# Patient Record
Sex: Male | Born: 1965 | Race: White | Hispanic: No | Marital: Married | Smoking: Former smoker
Health system: Southern US, Community
[De-identification: ages and names within clinical notes are randomized; demographics above are authoritative.]

## PROBLEM LIST (undated history)

## (undated) DIAGNOSIS — E785 Hyperlipidemia, unspecified: Secondary | ICD-10-CM

## (undated) DIAGNOSIS — M1990 Unspecified osteoarthritis, unspecified site: Secondary | ICD-10-CM

## (undated) DIAGNOSIS — T7840XA Allergy, unspecified, initial encounter: Secondary | ICD-10-CM

## (undated) DIAGNOSIS — F070 Personality change due to known physiological condition: Secondary | ICD-10-CM

## (undated) DIAGNOSIS — I10 Essential (primary) hypertension: Secondary | ICD-10-CM

## (undated) DIAGNOSIS — K219 Gastro-esophageal reflux disease without esophagitis: Secondary | ICD-10-CM

## (undated) DIAGNOSIS — G40909 Epilepsy, unspecified, not intractable, without status epilepticus: Secondary | ICD-10-CM

## (undated) HISTORY — DX: Unspecified osteoarthritis, unspecified site: M19.90

## (undated) HISTORY — DX: Gastro-esophageal reflux disease without esophagitis: K21.9

## (undated) HISTORY — DX: Epilepsy, unspecified, not intractable, without status epilepticus: G40.909

## (undated) HISTORY — DX: Essential (primary) hypertension: I10

## (undated) HISTORY — DX: Hyperlipidemia, unspecified: E78.5

## (undated) HISTORY — PX: CHOLECYSTECTOMY: SHX55

## (undated) HISTORY — DX: Allergy, unspecified, initial encounter: T78.40XA

## (undated) HISTORY — DX: Personality change due to known physiological condition: F07.0

---

## 2001-04-29 ENCOUNTER — Emergency Department (HOSPITAL_COMMUNITY): Admission: EM | Admit: 2001-04-29 | Discharge: 2001-04-29 | Payer: Self-pay | Admitting: Nurse Practitioner

## 2001-12-28 ENCOUNTER — Emergency Department (HOSPITAL_COMMUNITY): Admission: EM | Admit: 2001-12-28 | Discharge: 2001-12-28 | Payer: Self-pay | Admitting: *Deleted

## 2002-01-28 ENCOUNTER — Encounter (INDEPENDENT_AMBULATORY_CARE_PROVIDER_SITE_OTHER): Payer: Self-pay | Admitting: Specialist

## 2002-01-28 ENCOUNTER — Observation Stay (HOSPITAL_COMMUNITY): Admission: RE | Admit: 2002-01-28 | Discharge: 2002-01-29 | Payer: Self-pay | Admitting: General Surgery

## 2004-07-05 ENCOUNTER — Emergency Department (HOSPITAL_COMMUNITY): Admission: EM | Admit: 2004-07-05 | Discharge: 2004-07-05 | Payer: Self-pay | Admitting: Family Medicine

## 2005-05-28 ENCOUNTER — Ambulatory Visit: Payer: Self-pay | Admitting: Internal Medicine

## 2006-11-11 ENCOUNTER — Emergency Department (HOSPITAL_COMMUNITY): Admission: EM | Admit: 2006-11-11 | Discharge: 2006-11-11 | Payer: Self-pay | Admitting: Emergency Medicine

## 2007-06-23 ENCOUNTER — Ambulatory Visit: Payer: Self-pay | Admitting: Internal Medicine

## 2007-06-27 ENCOUNTER — Encounter: Payer: Self-pay | Admitting: Internal Medicine

## 2007-06-27 DIAGNOSIS — R569 Unspecified convulsions: Secondary | ICD-10-CM | POA: Insufficient documentation

## 2008-12-12 ENCOUNTER — Ambulatory Visit: Payer: Self-pay | Admitting: Internal Medicine

## 2008-12-12 DIAGNOSIS — F070 Personality change due to known physiological condition: Secondary | ICD-10-CM

## 2008-12-12 DIAGNOSIS — F988 Other specified behavioral and emotional disorders with onset usually occurring in childhood and adolescence: Secondary | ICD-10-CM | POA: Insufficient documentation

## 2008-12-12 DIAGNOSIS — R21 Rash and other nonspecific skin eruption: Secondary | ICD-10-CM | POA: Insufficient documentation

## 2008-12-12 DIAGNOSIS — I10 Essential (primary) hypertension: Secondary | ICD-10-CM | POA: Insufficient documentation

## 2008-12-12 HISTORY — DX: Personality change due to known physiological condition: F07.0

## 2008-12-13 LAB — CONVERTED CEMR LAB
ALT: 44 units/L (ref 0–53)
AST: 30 units/L (ref 0–37)
Albumin: 4.2 g/dL (ref 3.5–5.2)
Alkaline Phosphatase: 72 units/L (ref 39–117)
BUN: 8 mg/dL (ref 6–23)
Basophils Absolute: 0.1 10*3/uL (ref 0.0–0.1)
Basophils Relative: 1 % (ref 0.0–3.0)
Bilirubin Urine: NEGATIVE
Bilirubin, Direct: 0.1 mg/dL (ref 0.0–0.3)
CO2: 29 meq/L (ref 19–32)
Calcium: 9.3 mg/dL (ref 8.4–10.5)
Chloride: 106 meq/L (ref 96–112)
Cholesterol: 253 mg/dL — ABNORMAL HIGH (ref 0–200)
Creatinine, Ser: 0.7 mg/dL (ref 0.4–1.5)
Direct LDL: 189.8 mg/dL
Eosinophils Absolute: 0.2 10*3/uL (ref 0.0–0.7)
Eosinophils Relative: 2.6 % (ref 0.0–5.0)
GFR calc non Af Amer: 132.39 mL/min (ref >60)
Glucose, Bld: 85 mg/dL (ref 70–99)
HCT: 43.9 % (ref 39.0–52.0)
HDL: 34.8 mg/dL — ABNORMAL LOW (ref >39.00)
Hemoglobin, Urine: NEGATIVE
Hemoglobin: 15.8 g/dL (ref 13.0–17.0)
Ketones, ur: NEGATIVE mg/dL
Leukocytes, UA: NEGATIVE
Lymphocytes Relative: 30.7 % (ref 12.0–46.0)
Lymphs Abs: 1.9 10*3/uL (ref 0.7–4.0)
MCHC: 36 g/dL (ref 30.0–36.0)
MCV: 88.7 fL (ref 78.0–100.0)
Monocytes Absolute: 0.4 10*3/uL (ref 0.1–1.0)
Monocytes Relative: 7 % (ref 3.0–12.0)
Neutro Abs: 3.5 10*3/uL (ref 1.4–7.7)
Neutrophils Relative %: 58.7 % (ref 43.0–77.0)
Nitrite: NEGATIVE
PSA: 1.2 ng/mL (ref 0.10–4.00)
Platelets: 203 10*3/uL (ref 150.0–400.0)
Potassium: 4.6 meq/L (ref 3.5–5.1)
RBC: 4.95 M/uL (ref 4.22–5.81)
RDW: 11.5 % (ref 11.5–14.6)
Sodium: 143 meq/L (ref 135–145)
Specific Gravity, Urine: 1.02 (ref 1.000–1.030)
TSH: 1.24 microintl units/mL (ref 0.35–5.50)
Total Bilirubin: 0.8 mg/dL (ref 0.3–1.2)
Total CHOL/HDL Ratio: 7
Total Protein, Urine: NEGATIVE mg/dL
Total Protein: 7.1 g/dL (ref 6.0–8.3)
Triglycerides: 229 mg/dL — ABNORMAL HIGH (ref 0.0–149.0)
Urine Glucose: NEGATIVE mg/dL
Urobilinogen, UA: 0.2 (ref 0.0–1.0)
VLDL: 45.8 mg/dL — ABNORMAL HIGH (ref 0.0–40.0)
Valproic Acid Lvl: 51.4 ug/mL (ref 50.0–100.0)
WBC: 6.1 10*3/uL (ref 4.5–10.5)
pH: 5.5 (ref 5.0–8.0)

## 2010-10-23 ENCOUNTER — Ambulatory Visit
Admission: RE | Admit: 2010-10-23 | Discharge: 2010-10-23 | Payer: Self-pay | Source: Home / Self Care | Attending: Internal Medicine | Admitting: Internal Medicine

## 2010-10-23 ENCOUNTER — Other Ambulatory Visit: Payer: Self-pay | Admitting: Internal Medicine

## 2010-10-23 ENCOUNTER — Encounter: Payer: Self-pay | Admitting: Internal Medicine

## 2010-10-23 DIAGNOSIS — E785 Hyperlipidemia, unspecified: Secondary | ICD-10-CM | POA: Insufficient documentation

## 2010-10-23 DIAGNOSIS — H0019 Chalazion unspecified eye, unspecified eyelid: Secondary | ICD-10-CM | POA: Insufficient documentation

## 2010-10-23 LAB — URINALYSIS
Bilirubin Urine: NEGATIVE
Hemoglobin, Urine: NEGATIVE
Ketones, ur: NEGATIVE
Leukocytes, UA: NEGATIVE
Nitrite: NEGATIVE
Specific Gravity, Urine: 1.025 (ref 1.000–1.030)
Total Protein, Urine: NEGATIVE
Urine Glucose: NEGATIVE
Urobilinogen, UA: 1 (ref 0.0–1.0)
pH: 6 (ref 5.0–8.0)

## 2010-10-23 LAB — LIPID PANEL
Cholesterol: 240 mg/dL — ABNORMAL HIGH (ref 0–200)
HDL: 37.8 mg/dL — ABNORMAL LOW (ref >39.00)
Total CHOL/HDL Ratio: 6
Triglycerides: 335 mg/dL — ABNORMAL HIGH (ref 0.0–149.0)
VLDL: 67 mg/dL — ABNORMAL HIGH (ref 0.0–40.0)

## 2010-10-23 LAB — CBC WITH DIFFERENTIAL/PLATELET
Basophils Absolute: 0.1 10*3/uL (ref 0.0–0.1)
Basophils Relative: 0.6 % (ref 0.0–3.0)
Eosinophils Absolute: 0.2 10*3/uL (ref 0.0–0.7)
Eosinophils Relative: 2.1 % (ref 0.0–5.0)
HCT: 43.1 % (ref 39.0–52.0)
Hemoglobin: 15.3 g/dL (ref 13.0–17.0)
Lymphocytes Relative: 28.1 % (ref 12.0–46.0)
Lymphs Abs: 2.5 10*3/uL (ref 0.7–4.0)
MCHC: 35.4 g/dL (ref 30.0–36.0)
MCV: 90.3 fl (ref 78.0–100.0)
Monocytes Absolute: 0.7 10*3/uL (ref 0.1–1.0)
Monocytes Relative: 7.4 % (ref 3.0–12.0)
Neutro Abs: 5.5 10*3/uL (ref 1.4–7.7)
Neutrophils Relative %: 61.8 % (ref 43.0–77.0)
Platelets: 239 10*3/uL (ref 150.0–400.0)
RBC: 4.78 Mil/uL (ref 4.22–5.81)
RDW: 13 % (ref 11.5–14.6)
WBC: 8.9 10*3/uL (ref 4.5–10.5)

## 2010-10-23 LAB — BASIC METABOLIC PANEL
BUN: 10 mg/dL (ref 6–23)
CO2: 29 mEq/L (ref 19–32)
Calcium: 9.5 mg/dL (ref 8.4–10.5)
Chloride: 104 mEq/L (ref 96–112)
Creatinine, Ser: 0.7 mg/dL (ref 0.4–1.5)
GFR: 137.99 mL/min (ref >60.00)
Glucose, Bld: 76 mg/dL (ref 70–99)
Potassium: 4.5 mEq/L (ref 3.5–5.1)
Sodium: 141 mEq/L (ref 135–145)

## 2010-10-23 LAB — HEPATIC FUNCTION PANEL
ALT: 49 U/L (ref 0–53)
AST: 28 U/L (ref 0–37)
Albumin: 4.3 g/dL (ref 3.5–5.2)
Alkaline Phosphatase: 70 U/L (ref 39–117)
Bilirubin, Direct: 0.1 mg/dL (ref 0.0–0.3)
Total Bilirubin: 0.6 mg/dL (ref 0.3–1.2)
Total Protein: 7.4 g/dL (ref 6.0–8.3)

## 2010-10-23 LAB — TSH: TSH: 0.91 u[IU]/mL (ref 0.35–5.50)

## 2010-10-23 LAB — PSA: PSA: 0.29 ng/mL (ref 0.10–4.00)

## 2010-10-23 LAB — LDL CHOLESTEROL, DIRECT: Direct LDL: 188.9 mg/dL

## 2010-10-24 LAB — CONVERTED CEMR LAB: Valproic Acid Lvl: 16 ug/mL — ABNORMAL LOW (ref 50.0–100.0)

## 2010-10-31 NOTE — Assessment & Plan Note (Signed)
Summary: last ov 2010/skin tag/cd   Vital Signs:  Patient profile:   45 year old male Height:      73 inches Weight:      230 pounds BMI:     30.45 O2 Sat:      96 % on Room air Temp:     98.4 degrees F oral Pulse rate:   99 / minute BP sitting:   144 / 90  (left arm) Cuff size:   large  Vitals Entered By: Zella Ball Ewing CMA (AAMA) (October 23, 2010 2:45 PM)  O2 Flow:  Room air  Preventive Care Screening     decliens tetanus today  CC: Elevated BP, skin tags/RE   CC:  Elevated BP and skin tags/RE.  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  No fever, wt loss, night sweats, loss of appetite or other constitutional symptoms  Denies worsening depressive symptoms, suicidal ideation, or panic.  Overall good compliance with meds, and good tolerability.  Pt states good ability with ADL's, low fall risk, home safety reviewed and adequate, no significant change in hearing or vision, trying to follow lower chol diet, and occasionally active only with regular excercise.   Preventive Screening-Counseling & Management      Drug Use:  no.    Problems Prior to Update: 1)  Hyperlipidemia  (ICD-272.4) 2)  Hypertension  (ICD-401.9) 3)  Poor Concentration   (ICD-310.1) 4)  Rash-nonvesicular  (ICD-782.1) 5)  Preventive Health Care  (ICD-V70.0) 6)  Seizure Disorder  (ICD-780.39)  Medications Prior to Update: 1)  Depakote 500 Mg Tbec (Divalproex Sodium) .Marland Kitchen.. 1 By Mouth in The Morning and 1 By Mouth At Night 2)  Ketoconazole 200 Mg Tabs (Ketoconazole) .Marland Kitchen.. 1 By Mouth Two Times A Day 3)  Lisinopril 10 Mg Tabs (Lisinopril) .... By Mouth Once Daily  Current Medications (verified): 1)  Depakote 500 Mg Tbec (Divalproex Sodium) .Marland Kitchen.. 1 By Mouth in The Morning and 1 By Mouth At Night 2)  Ketoconazole 200 Mg Tabs (Ketoconazole) .Marland Kitchen.. 1 By Mouth Two Times A Day 3)  Lisinopril 10 Mg Tabs (Lisinopril) .... By Mouth Once Daily 4)  Atorvastatin Calcium 10 Mg Tabs (Atorvastatin Calcium) .Marland Kitchen.. 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[place] Current Smoker - 1/2 ppd Alcohol use-yes - 12 pk /wk Drug use-no  Risk Factors: Smoking Status: current (12/12/2008) Packs/Day: 1 PPD (06/27/2007)  Past Medical History: Seizure disorder - none since 2001 Hypertension Hyperlipidemia  Social History: Married no biological children, has one step son 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         all otherwise negative per pt -    Physical Exam  General:  alert and overweight-appearing.   Head:  normocephalic and atraumatic.   Eyes:  pupils equal and pupils round.  ,  has 2 mm skin tag to right temple approx .5 cm lateral to right eye, also has right lower mid eyelid edge 5 mm chalazion Ears:  R ear normal and L ear normal.   Nose:  no external deformity and no external erythema.   Mouth:  good dentition and no gingival abnormalities.   Neck:  supple and no masses.   Lungs:  normal respiratory effort and normal breath sounds.   Heart:  normal rate, regular rhythm, and no murmur.   Abdomen:  soft, non-tender, and normal bowel sounds.   Msk:  no joint tenderness and no joint swelling.   Extremities:  no edema, no ulcers  Neurologic:  cranial nerves II-XII intact and strength normal in all extremities.     Impression & Recommendations:  Problem # 1:  Preventive Health Care (ICD-V70.0) Overall doing well, age appropriate education and counseling updated, referral for preventive services and immunizations addressed, dietary counseling and smoking status adressed , most recent labs reviewed I have personally reviewed and have noted 1.The patient's medical and social history 2.Their use of alcohol, tobacco or illicit drugs 3.Their current medications and supplements 4. Functional ability including ADL's, fall risk, home safety risk, hearing & visual impairment  5.Diet and physical activities 6.Evidence for depression or mood disorders The patients weight, height, BMI  have been recorded in the chart I have made referrals, counseling and provided education to the patient based review of the above  Orders: TLB-BMP (Basic Metabolic Panel-BMET) (80048-METABOL) TLB-CBC Platelet - w/Differential (85025-CBCD) TLB-Hepatic/Liver Function Pnl (80076-HEPATIC) TLB-Lipid Panel (80061-LIPID) TLB-TSH (Thyroid  Stimulating Hormone) (84443-TSH) TLB-PSA (Prostate Specific Antigen) (84153-PSA) TLB-Udip ONLY (81003-UDIP)  Problem # 2:  SEIZURE DISORDER (ICD-780.39)  His updated medication list for this problem includes:    Depakote 500 Mg Tbec (Divalproex sodium) .Marland Kitchen... 1 by mouth in the morning and 1 by mouth at night will check lab and forward to neuro per pt request, no recent siezure  Orders: T-Valproic Acid (Depakene) (91478-29562)  Problem # 3:  HYPERTENSION (ICD-401.9)  His updated medication list for this problem includes:    Lisinopril 10 Mg Tabs (Lisinopril) ..... By mouth once daily  BP today: 144/90 Prior BP: 140/110 (12/12/2008)  Labs Reviewed: K+: 4.6 (12/12/2008) Creat: : 0.7 (12/12/2008)   Chol: 253 (12/12/2008)   HDL: 34.80 (12/12/2008)   TG: 229.0 (12/12/2008) uncontrolle - to re-start med - treat as above, f/u any worsening signs or symptoms   Problem # 4:  HYPERLIPIDEMIA (ICD-272.4)  His updated medication list for this problem includes:    Atorvastatin Calcium 10 Mg Tabs (Atorvastatin calcium) .Marland Kitchen... 1 by mouth once daily  Labs Reviewed: SGOT: 30 (12/12/2008)   SGPT: 44 (12/12/2008)   HDL:34.80 (12/12/2008)  Chol:253 (12/12/2008)  Trig:229.0 (  12/12/2008) d/w pt  - he admits to dietary noncomplaicne, will do better and also elects to start lipitor with f/u labs  Problem # 5:  CHALAZION, RIGHT (ICD-373.2) c/w pt   - he elects to research local opthomology and self refer for further eval and tx  Complete Medication List: 1)  Depakote 500 Mg Tbec (Divalproex sodium) .Marland Kitchen.. 1 by mouth in the morning and 1 by mouth at night 2)  Ketoconazole 200 Mg Tabs (Ketoconazole) .Marland Kitchen.. 1 by mouth two times a day 3)  Lisinopril 10 Mg Tabs (Lisinopril) .... By mouth once daily 4)  Atorvastatin Calcium 10 Mg Tabs (Atorvastatin calcium) .Marland Kitchen.. 1 by mouth once daily  Patient Instructions: 1)  Please stop smoking 2)  Please take all new medications as prescribed 3)  Continue all previous  medications as before this visit  4)  Please go to the Lab in the basement for your blood and/or urine tests today 5)  Please call the number on the [facility] Card for results of your testing 6)  You should see an opthomologist about the cyst to the right lower eyelid 7)  We will fax the results of your blood work to Neurology when available 8)  Please schedule a follow-up appointment in 1 year, or sooner if needed 9)  Check your Blood Pressure regularly. If it is above 140/90: you should make an appointment. Prescriptions: ATORVASTATIN CALCIUM 10 MG TABS (ATORVASTATIN CALCIUM) 1 by mouth once daily  #90 x 3   Entered and Authorized by:   Corwin Levins MD   Signed by:   Corwin Levins MD on 10/23/2010   Method used:   Electronically to        [facility] [phone]* (retail)       [address]       [place], [place]  [address]       Ph: [phone]       Fax: [phone]   RxID:   [id number] LISINOPRIL 10 MG TABS (LISINOPRIL) by mouth once daily  #90 x 3   Entered and Authorized by:   Corwin Levins MD   Signed by:   Corwin Levins MD on 10/23/2010   Method used:   Electronically to        [facility] [phone]* (retail)       [address]       [place], [place]  [address]       Ph: [phone]       Fax: [phone]   RxID:   [id number] KETOCONAZOLE 200 MG TABS (KETOCONAZOLE) 1 by mouth two times a day  #14 x 0   Entered and Authorized by:   Corwin Levins MD   Signed by:   Corwin Levins MD on 10/23/2010   Method used:   Electronically to        [facility] [phone]* (retail)       [address]       [place], [place]  [address]       Ph: [phone]       Fax: [phone]   RxID:   [id number]    Orders Added: 1)  T-Valproic Acid (Depakene) [80164-23520] 2)  TLB-BMP (Basic Metabolic Panel-BMET) [80048-METABOL] 3)  TLB-CBC Platelet - w/Differential [85025-CBCD] 4)  TLB-Hepatic/Liver Function Pnl [80076-HEPATIC] 5)  TLB-Lipid Panel [80061-LIPID] 6)   TLB-TSH (Thyroid Stimulating Hormone) [84443-TSH] 7)  TLB-PSA (Prostate Specific Antigen) [84153-PSA] 8)  TLB-Udip ONLY [81003-UDIP] 9)  Est. Patient 40-64 years [phone]

## 2011-02-14 NOTE — Op Note (Signed)
[facility]  Patient:    Kenneth Atkins, Kenneth Atkins Visit Number: [id number] MRN: [id number]          Service Type: SUR Location: [id number] Attending Physician:  Delsa Bern Dictated by:   Lorne Skeens. Hoxworth, M.D. Proc. Date: 01/28/02 Admit Date:  01/28/2002                             Operative Report  PREOPERATIVE DIAGNOSIS:  Symptomatic cholelithiasis.  POSTOPERATIVE DIAGNOSIS:  Symptomatic cholelithiasis.  SURGICAL PROCEDURE:  Laparoscopic cholecystectomy.  SURGEON:  Lorne Skeens. Hoxworth, M.D.  ASSISTANT:  Currie Paris, M.D.  ANESTHESIA:  General.  BRIEF HISTORY:  Kenneth Atkins is a 45 year old white male, who recently presented to the emergency room with severe epigastric right upper quadrant abdominal pain, radiating to his flank, associated with nausea and vomiting. Work-up included a gallbladder ultrasound, showing numerous gallstones. Common bile duct was normal.  Laparoscopic cholecystectomy has been recommended and accepted.  The nature of the procedure, its indications, risks of bleeding, infection, bile leak, and bile duct injury were discussed and understood preoperatively.  He is now brought to the operating room for this procedure.  DESCRIPTION OF OPERATION:  The patient brought to the operating room and placed in the supine position on the operating table, and general endotracheal anesthesia was induced.  PAS were in place.  He was given preoperative antibiotics.  The abdomen was sterilely prepped and draped.  Local anesthesia was used to infiltrate the trocar sites.  A 1 cm incision was made at the umbilicus and dissection carried down the midline fascia which was sharply incised for 1 cm and the peritoneum entered under direct vision.  Through a mattress suture of 0 Vicryl, the Hasson trocar was placed and pneumoperitoneum established.  Under direct vision, a 10 mm trocar was placed in the subxiphoid area  and two 5 mm trocars on the right subcostal margin.  The gallbladder was visualized.  It was not acutely inflamed but was literally packed with stones. The fundus was grasped and elevated up over the liver, and the infundibulum retracted inferolaterally.  Fibrofatty tissue was stripped off the neck of the gallbladder toward the porta hepatis, and the distal gallbladder was thoroughly dissected.  The cystic artery was seen coursing up onto the gallbladder wall.  The cystic duct was identified and dissected free and the cystic duct gallbladder junction dissected free 360 degrees.  When the anatomy was clear, the cystic artery was doubly clipped proximally, clipped distally, and divided, and the cystic duct was triply clipped proximally, clipped distally, and divided.  The gallbladder was then dissected free from its bed using hook cautery and removed through the umbilicus.  Complete hemostasis was assured in the operative site.  Trocars were removed under direct vision and all CO2 evacuated from the peritoneal cavity.  The mattress suture was secured at the umbilicus.  Skin incisions were closed with interrupted subcuticular 4-0 Monocryl and Steri-Strips.  Sponge, needle, and instrument counts were correct.  Dry sterile dressing was applied, and the patient was taken to recovery in good condition. Dictated by:   Lorne Skeens. Hoxworth, M.D. Attending Physician:  Delsa Bern DD:  01/28/02 TD:  01/29/02 Job: [id number]

## 2011-10-02 ENCOUNTER — Ambulatory Visit: Payer: Self-pay | Admitting: Internal Medicine

## 2011-10-03 ENCOUNTER — Ambulatory Visit (INDEPENDENT_AMBULATORY_CARE_PROVIDER_SITE_OTHER): Payer: BC Managed Care – PPO | Admitting: Internal Medicine

## 2011-10-03 ENCOUNTER — Encounter: Payer: Self-pay | Admitting: Internal Medicine

## 2011-10-03 VITALS — BP 118/90 | HR 88 | Temp 98.5°F | Ht 73.0 in | Wt 233.4 lb

## 2011-10-03 DIAGNOSIS — I10 Essential (primary) hypertension: Secondary | ICD-10-CM

## 2011-10-03 DIAGNOSIS — Z0001 Encounter for general adult medical examination with abnormal findings: Secondary | ICD-10-CM | POA: Insufficient documentation

## 2011-10-03 DIAGNOSIS — H0019 Chalazion unspecified eye, unspecified eyelid: Secondary | ICD-10-CM

## 2011-10-03 DIAGNOSIS — Z0000 Encounter for general adult medical examination without abnormal findings: Secondary | ICD-10-CM

## 2011-10-03 DIAGNOSIS — H0014 Chalazion left upper eyelid: Secondary | ICD-10-CM | POA: Insufficient documentation

## 2011-10-03 NOTE — Patient Instructions (Signed)
No new medications today You will be contacted regarding the referral for: opthamology Please return in 6 mo with Lab testing done 3-5 days before

## 2011-10-04 ENCOUNTER — Encounter: Payer: Self-pay | Admitting: Internal Medicine

## 2011-10-04 NOTE — Progress Notes (Signed)
  Subjective:    Patient ID: Kenneth Atkins, male    DOB: [birth year], 46 y.o.   MRN: [id number]  HPI  Here with recent stye to the left upper eyelid that did not resolve, but now with a cystic swelling for the past wk;   Pt denies fever, wt loss, night sweats, loss of appetite, or other constitutional symptoms. Pt denies chest pain, increased sob or doe, wheezing, orthopnea, PND, increased LE swelling, palpitations, dizziness or syncope.  Pt denies new neurological symptoms such as new headache, or facial or extremity weakness or numbness   Pt denies polydipsia, polyuria Past Medical History  Diagnosis Date  . Hypertension   . Hyperlipidemia   . Seizure disorder     None since 2001   Past Surgical History  Procedure Date  . Cholecystectomy     reports that he has been smoking.  He does not have any smokeless tobacco history on file. He reports that he drinks about 7.2 ounces of alcohol per week. He reports that he does not use illicit drugs. family history includes Alcohol abuse in his father and Hypertension in his brother and father. No Known Allergies Current Outpatient Prescriptions on File Prior to Visit  Medication Sig Dispense Refill  . atorvastatin (LIPITOR) 10 MG tablet Take 10 mg by mouth daily.        . divalproex (DEPAKOTE) 500 MG DR tablet Take 500 mg by mouth 2 (two) times daily.        Marland Kitchen lisinopril (PRINIVIL,ZESTRIL) 10 MG tablet Take 10 mg by mouth daily.        Marland Kitchen ketoconazole (NIZORAL) 200 MG tablet Take 200 mg by mouth 2 (two) times daily.         Review of Systems All otherwise neg per pt    Objective:   Physical Exam BP 118/90  Pulse 88  Temp(Src) 98.5 F (36.9 C) (Oral)  Ht 6\' 1"  (1.854 m)  Wt 233 lb 6 oz (105.858 kg)  BMI 30.79 kg/m2  SpO2 97% Physical Exam  VS noted Constitutional: Pt appears well-developed and well-nourished.  HENT: Head: Normocephalic. Left upper eyelid with approx 10 mm chalazion, nontedner, minor erythema, nonfluctuant Right Ear:  External ear normal.  Left Ear: External ear normal.  Eyes: Conjunctivae and EOM are normal. Pupils are equal, round, and reactive to light.  Neck: Normal range of motion. Neck supple.  Cardiovascular: Normal rate and regular rhythm.   Pulmonary/Chest: Effort normal and breath sounds normal.  Abd:  Soft, NT, non-distended, + BS Neurological: Pt is alert. No cranial nerve deficit.  Skin: Skin is warm. No erythema.  Psychiatric: Pt behavior is normal. Thought content normal.     Assessment & Plan:

## 2011-10-04 NOTE — Assessment & Plan Note (Signed)
Mild, d/w pt, for optho referral,  to f/u any worsening symptoms or concerns

## 2011-10-04 NOTE — Assessment & Plan Note (Signed)
stable overall by hx and exam, most recent data reviewed with pt, and pt to continue medical treatment as before  BP Readings from Last 3 Encounters:  10/03/11 118/90  10/23/10 144/90  12/12/08 140/110

## 2012-03-22 ENCOUNTER — Other Ambulatory Visit: Payer: Self-pay | Admitting: Internal Medicine

## 2012-04-20 ENCOUNTER — Other Ambulatory Visit: Payer: Self-pay

## 2012-04-20 MED ORDER — LISINOPRIL 10 MG PO TABS: 10.0000 mg | ORAL_TABLET | Freq: Every day | ORAL | Status: DC

## 2012-11-09 ENCOUNTER — Other Ambulatory Visit: Payer: Self-pay | Admitting: Internal Medicine

## 2012-12-24 ENCOUNTER — Telehealth: Payer: Self-pay | Admitting: Internal Medicine

## 2012-12-24 MED ORDER — LISINOPRIL 10 MG PO TABS: 10.0000 mg | ORAL_TABLET | Freq: Every day | ORAL | Status: DC

## 2012-12-24 NOTE — Telephone Encounter (Signed)
Pt req refill for Lisinopril 10mg . Pt is out of this med , pt has an appt on 12/27/12. Please call this in to [facility] if this is ok.

## 2012-12-24 NOTE — Telephone Encounter (Signed)
Refill completed as requested. 

## 2012-12-27 ENCOUNTER — Ambulatory Visit (INDEPENDENT_AMBULATORY_CARE_PROVIDER_SITE_OTHER): Payer: BC Managed Care – PPO | Admitting: Internal Medicine

## 2012-12-27 ENCOUNTER — Encounter: Payer: Self-pay | Admitting: Internal Medicine

## 2012-12-27 ENCOUNTER — Other Ambulatory Visit (INDEPENDENT_AMBULATORY_CARE_PROVIDER_SITE_OTHER): Payer: BC Managed Care – PPO

## 2012-12-27 ENCOUNTER — Telehealth: Payer: Self-pay | Admitting: Internal Medicine

## 2012-12-27 VITALS — BP 142/100 | HR 77 | Temp 98.0°F | Ht 73.0 in | Wt 218.0 lb

## 2012-12-27 DIAGNOSIS — Z0000 Encounter for general adult medical examination without abnormal findings: Secondary | ICD-10-CM

## 2012-12-27 DIAGNOSIS — I10 Essential (primary) hypertension: Secondary | ICD-10-CM

## 2012-12-27 DIAGNOSIS — G40909 Epilepsy, unspecified, not intractable, without status epilepticus: Secondary | ICD-10-CM

## 2012-12-27 LAB — HEPATIC FUNCTION PANEL
AST: 23 U/L (ref 0–37)
Albumin: 4.3 g/dL (ref 3.5–5.2)
Alkaline Phosphatase: 71 U/L (ref 39–117)
Total Protein: 7.2 g/dL (ref 6.0–8.3)

## 2012-12-27 LAB — BASIC METABOLIC PANEL
CO2: 27 mEq/L (ref 19–32)
Glucose, Bld: 75 mg/dL (ref 70–99)
Potassium: 4.7 mEq/L (ref 3.5–5.1)
Sodium: 136 mEq/L (ref 135–145)

## 2012-12-27 LAB — CBC WITH DIFFERENTIAL/PLATELET
Basophils Absolute: 0.1 10*3/uL (ref 0.0–0.1)
Eosinophils Absolute: 0.2 10*3/uL (ref 0.0–0.7)
HCT: 46.2 % (ref 39.0–52.0)
Lymphs Abs: 1.7 10*3/uL (ref 0.7–4.0)
Monocytes Relative: 8.5 % (ref 3.0–12.0)
Platelets: 235 10*3/uL (ref 150.0–400.0)
RDW: 12.6 % (ref 11.5–14.6)

## 2012-12-27 LAB — PSA: PSA: 0.32 ng/mL (ref 0.10–4.00)

## 2012-12-27 LAB — TSH: TSH: 0.88 u[IU]/mL (ref 0.35–5.50)

## 2012-12-27 LAB — URINALYSIS, ROUTINE W REFLEX MICROSCOPIC
Bilirubin Urine: NEGATIVE
Hgb urine dipstick: NEGATIVE
Ketones, ur: NEGATIVE
Leukocytes, UA: NEGATIVE
Specific Gravity, Urine: 1.015 (ref 1.000–1.030)
Urobilinogen, UA: 1 (ref 0.0–1.0)

## 2012-12-27 LAB — LDL CHOLESTEROL, DIRECT: Direct LDL: 136.5 mg/dL

## 2012-12-27 MED ORDER — LISINOPRIL 20 MG PO TABS: 20.0000 mg | ORAL_TABLET | Freq: Every day | ORAL | Status: DC

## 2012-12-27 MED ORDER — ATORVASTATIN CALCIUM 20 MG PO TABS: 20.0000 mg | ORAL_TABLET | Freq: Every day | ORAL | Status: DC

## 2012-12-27 MED ORDER — LISINOPRIL 10 MG PO TABS: 10.0000 mg | ORAL_TABLET | Freq: Every day | ORAL | Status: DC

## 2012-12-27 NOTE — Patient Instructions (Addendum)
Please increase the lisiopril to 20 mg per day Please call or message on Mychart if BP does not seem improved in the next 1-2 wks Please continue all other medications as before, and refills have been done if requested. Please continue your efforts at being more active, low cholesterol diet, and weight control. You are otherwise up to date with prevention measures today. Please have the pharmacy call with any other refills you may need. Please go to the LAB in the Basement (turn left off the elevator) for the tests to be done today You will be contacted by phone if any changes need to be made immediately.  Otherwise, you will receive a letter about your results with an explanation, but please check with MyChart first. Thank you for enrolling in MyChart. Please follow the instructions below to securely access your online medical record. MyChart allows you to send messages to your doctor, view your test results, renew your prescriptions, schedule appointments, and more. To Log into My Chart online, please go by [employer] or [employer] to [email or web address], or download the MyChart App from the [employer] of [employer] .  Your Username is: [email or web address] ([email or web address]) Please keep your appointments with your specialists as you have planned Please return in 6 months, or sooner if needed

## 2012-12-27 NOTE — Assessment & Plan Note (Signed)
Mild uncontrolled, to incr the ACE to 20 mg per day, f/u bp at home and next visit

## 2012-12-27 NOTE — Telephone Encounter (Signed)
To robin;  Actually we need to increase the lipitor from 10 to 20 mg (not start the 10)  Robin to inform pt

## 2012-12-27 NOTE — Assessment & Plan Note (Signed)

## 2012-12-27 NOTE — Progress Notes (Signed)
Subjective:    Patient ID: Kenneth Atkins, male    DOB: [birth year], 47 y.o.   MRN: [id number]  HPI  Here for wellness and f/u;  Overall doing ok;  Pt denies CP, worsening SOB, DOE, wheezing, orthopnea, PND, worsening LE edema, palpitations, dizziness or syncope.  Pt denies neurological change such as new headache, facial or extremity weakness.  Pt denies polydipsia, polyuria, or low sugar symptoms. Pt states overall good compliance with treatment and medications, good tolerability, and has been trying to follow lower cholesterol diet.  Pt denies worsening depressive symptoms, suicidal ideation or panic. No fever, night sweats, wt loss, loss of appetite, or other constitutional symptoms.  Pt states good ability with ADL's, has low fall risk, home safety reviewed and adequate, no other significant changes in hearing or vision, and only occasionally active with exercise.  Brother with AoV replacement and CABG at 45yo.   Ouf of meds for 1 wk, BP still mild high at work, despite even losing about 15 lbs intentionally in past 3-6 mo.  Saw neruology earlier this yr, considering coming off depakote since last sz about 14 yrs but is starting new job and pt did not want to change. No labs done then.  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 Past Medical History  Diagnosis Date  . Hypertension   . Hyperlipidemia   . Seizure disorder     None since 2001   Past Surgical History  Procedure Laterality Date  . Cholecystectomy      reports that he has been smoking.  He does not have any smokeless tobacco history on file. He reports that he drinks about 7.2 ounces of alcohol per week. He reports that he does not use illicit drugs. family history includes Alcohol abuse in his father and Hypertension in his brother and father. No Known Allergies Current Outpatient Prescriptions on File Prior to Visit  Medication Sig Dispense Refill  . atorvastatin (LIPITOR) 10 MG  tablet TAKE ONE TABLET BY MOUTH EVERY DAY  90 tablet  2  . divalproex (DEPAKOTE) 500 MG DR tablet Take 500 mg by mouth 2 (two) times daily.        Marland Kitchen ketoconazole (NIZORAL) 200 MG tablet Take 200 mg by mouth 2 (two) times daily.         No current facility-administered medications on file prior to visit.    Review of Systems Constitutional: Negative for diaphoresis, activity change, appetite change or unexpected weight change.  HENT: Negative for hearing loss, ear pain, facial swelling, mouth sores and neck stiffness.   Eyes: Negative for pain, redness and visual disturbance.  Respiratory: Negative for shortness of breath and wheezing.   Cardiovascular: Negative for chest pain and palpitations.  Gastrointestinal: Negative for diarrhea, blood in stool, abdominal distention or other pain Genitourinary: Negative for hematuria, flank pain or change in urine volume.  Musculoskeletal: Negative for myalgias and joint swelling.  Skin: Negative for color change and wound.  Neurological: Negative for syncope and numbness. other than noted Hematological: Negative for adenopathy.  Psychiatric/Behavioral: Negative for hallucinations, self-injury, decreased concentration and agitation.      Objective:   Physical Exam BP 142/100  Pulse 77  Temp(Src) 98 F (36.7 C) (Oral)  Ht 6\' 1"  (1.854 m)  Wt 218 lb (98.884 kg)  BMI 28.77 kg/m2  SpO2 98% VS noted,  Constitutional: Pt is oriented to person, place, and time. Appears well-developed and well-nourished.  Head: Normocephalic and atraumatic.  Right Ear: External ear normal.  Left Ear: External ear normal.  Nose: Nose normal.  Mouth/Throat: Oropharynx is clear and moist.  Eyes: Conjunctivae and EOM are normal. Pupils are equal, round, and reactive to light.  Neck: Normal range of motion. Neck supple. No JVD present. No tracheal deviation present.  Cardiovascular: Normal rate, regular rhythm, normal heart sounds and intact distal pulses.    Pulmonary/Chest: Effort normal and breath sounds normal.  Abdominal: Soft. Bowel sounds are normal. There is no tenderness. No HSM  Musculoskeletal: Normal range of motion. Exhibits no edema.  Lymphadenopathy:  Has no cervical adenopathy.  Neurological: Pt is alert and oriented to person, place, and time. Pt has normal reflexes. No cranial nerve deficit.  Skin: Skin is warm and dry. No rash noted.  Psychiatric:  Has  normal mood and affect. Behavior is normal. mild nervous    Assessment & Plan:

## 2012-12-28 NOTE — Telephone Encounter (Signed)
Called left message to call back 

## 2012-12-28 NOTE — Telephone Encounter (Signed)
Patient informed to start Lipitor 20 mg

## 2013-03-30 ENCOUNTER — Encounter: Payer: Self-pay | Admitting: Internal Medicine

## 2013-03-31 ENCOUNTER — Encounter: Payer: Self-pay | Admitting: Internal Medicine

## 2013-03-31 NOTE — Telephone Encounter (Signed)
Robin to check with pt pharmacy regarding last vyvanse rx , mg, date and number last prescribed

## 2013-04-11 MED ORDER — LISDEXAMFETAMINE DIMESYLATE 20 MG PO CAPS: 20.0000 mg | ORAL_CAPSULE | Freq: Every day | ORAL | Status: DC

## 2013-04-11 NOTE — Telephone Encounter (Signed)
Done hardcopy to robin  

## 2013-05-14 ENCOUNTER — Encounter: Payer: Self-pay | Admitting: Internal Medicine

## 2013-05-14 ENCOUNTER — Other Ambulatory Visit: Payer: Self-pay | Admitting: Diagnostic Neuroimaging

## 2013-05-16 MED ORDER — LISDEXAMFETAMINE DIMESYLATE 20 MG PO CAPS: 20.0000 mg | ORAL_CAPSULE | Freq: Every day | ORAL | Status: DC

## 2013-05-16 NOTE — Telephone Encounter (Signed)
Done hardcopy to robin  

## 2013-05-23 NOTE — Telephone Encounter (Signed)
Robin to see above mychart message regarding possible change to concerta

## 2013-05-30 ENCOUNTER — Encounter: Payer: Self-pay | Admitting: Internal Medicine

## 2013-06-06 ENCOUNTER — Telehealth: Payer: Self-pay | Admitting: Internal Medicine

## 2013-06-06 NOTE — Telephone Encounter (Signed)
Pt came into pick up rx for concerta, did not see anything. Please advise.

## 2013-06-07 MED ORDER — METHYLPHENIDATE HCL ER (OSM) 27 MG PO TBCR: 27.0000 mg | EXTENDED_RELEASE_TABLET | ORAL | Status: DC

## 2013-06-07 NOTE — Telephone Encounter (Signed)
Called the patient informed to pickup hardcopy at the front desk. 

## 2013-06-07 NOTE — Telephone Encounter (Signed)
Done hardcopy to robin  

## 2013-06-28 ENCOUNTER — Ambulatory Visit: Payer: BC Managed Care – PPO | Admitting: Internal Medicine

## 2013-07-07 ENCOUNTER — Ambulatory Visit (INDEPENDENT_AMBULATORY_CARE_PROVIDER_SITE_OTHER): Payer: PRIVATE HEALTH INSURANCE | Admitting: Internal Medicine

## 2013-07-07 ENCOUNTER — Encounter: Payer: Self-pay | Admitting: Internal Medicine

## 2013-07-07 VITALS — BP 160/100 | HR 84 | Temp 97.8°F | Ht 73.0 in | Wt 219.5 lb

## 2013-07-07 DIAGNOSIS — I10 Essential (primary) hypertension: Secondary | ICD-10-CM

## 2013-07-07 DIAGNOSIS — Z23 Encounter for immunization: Secondary | ICD-10-CM

## 2013-07-07 DIAGNOSIS — F988 Other specified behavioral and emotional disorders with onset usually occurring in childhood and adolescence: Secondary | ICD-10-CM

## 2013-07-07 DIAGNOSIS — E785 Hyperlipidemia, unspecified: Secondary | ICD-10-CM

## 2013-07-07 MED ORDER — METHYLPHENIDATE HCL ER (OSM) 27 MG PO TBCR: 27.0000 mg | EXTENDED_RELEASE_TABLET | ORAL | Status: DC

## 2013-07-07 MED ORDER — LISINOPRIL 20 MG PO TABS: 20.0000 mg | ORAL_TABLET | Freq: Every day | ORAL | Status: DC

## 2013-07-07 NOTE — Progress Notes (Signed)
  Subjective:    Patient ID: Kenneth Atkins, male    DOB: 1965/11/26, 47 y.o.   MRN: [id number]  HPI Here to f/u; overall doing ok,  Pt denies chest pain, increased sob or doe, wheezing, orthopnea, PND, increased LE swelling, palpitations, dizziness or syncope.  Pt denies polydipsia, polyuria, or low sugar symptoms such as weakness or confusion improved with po intake.  Pt denies new neurological symptoms such as new headache, or facial or extremity weakness or numbness.   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 Has a left lower lid chalazion but holding on optho eval and tx due to high ins deductible.  For some reason states [facility] still dispensing 10 mg lisionpril, asks for repeat 20 mg rx due to persistent elev BP. >ADD med working well.  No recent siezure off the keppra Past Medical History  Diagnosis Date  . Hypertension   . Hyperlipidemia   . Seizure disorder     None since 2001   Past Surgical History  Procedure Laterality Date  . Cholecystectomy      reports that he has been smoking.  He does not have any smokeless tobacco history on file. He reports that he drinks about 7.2 ounces of alcohol per week. He reports that he does not use illicit drugs. family history includes Alcohol abuse in his father; Hypertension in his brother and father. No Known Allergies Current Outpatient Prescriptions on File Prior to Visit  Medication Sig Dispense Refill  . atorvastatin (LIPITOR) 20 MG tablet Take 1 tablet (20 mg total) by mouth daily.  90 tablet  3  . ketoconazole (NIZORAL) 200 MG tablet Take 200 mg by mouth 2 (two) times daily.         No current facility-administered medications on file prior to visit.   Review of Systems  Constitutional: Negative for unexpected weight change, or unusual diaphoresis   HENT: Negative for tinnitus.   Eyes: Negative for photophobia and visual disturbance.  Respiratory: Negative for choking and stridor.   Gastrointestinal: Negative for vomiting and blood in stool.  Genitourinary: Negative for hematuria and decreased urine volume.  Musculoskeletal: Negative for acute joint swelling Skin: Negative for color change and wound.  Neurological: Negative for tremors and numbness other than noted  Psychiatric/Behavioral: Negative for decreased concentration or  hyperactivity.  ;    Objective:   Physical Exam BP 160/100  Pulse 84  Temp(Src) 97.8 F (36.6 C) (Oral)  Ht 6\' 1"  (1.854 m)  Wt 219 lb 8 oz (99.565 kg)  BMI 28.97 kg/m2  SpO2 97% VS noted,  Constitutional: Pt appears well-developed and well-nourished.  HENT: Head: NCAT.  Right Ear: External ear normal.  Left Ear: External ear normal.  Eyes: Conjunctivae and EOM are normal. Pupils are equal, round, and reactive to light.  Neck: Normal range of motion. Neck supple.  Cardiovascular: Normal rate and regular rhythm.   Pulmonary/Chest: Effort normal and breath sounds normal.  Abd:  Soft, NT, non-distended, + BS Neurological: Pt is alert. Not confused  Skin: Skin is warm. No erythema.  Psychiatric: Pt behavior is normal. Thought content normal.         Assessment & Plan:

## 2013-07-07 NOTE — Assessment & Plan Note (Signed)
stable overall by history and exam, recent data reviewed with pt, and pt to continue medical treatment as before,  to f/u any worsening symptoms or concerns Lab Results  Component Value Date   CHOL 196 12/27/2012   HDL 33.90* 12/27/2012   LDLDIRECT 136.5 12/27/2012   TRIG 222.0* 12/27/2012   CHOLHDL 6 12/27/2012   To cont lower chol diet

## 2013-07-07 NOTE — Assessment & Plan Note (Signed)
stable overall by history and exam, and pt to continue medical treatment as before,  to f/u any worsening symptoms or concerns 

## 2013-07-07 NOTE — Assessment & Plan Note (Signed)
Mild elev on 10 mg lisniopril, to change to 20 mg, f/u BP at pharmacy and next visit

## 2013-07-07 NOTE — Patient Instructions (Addendum)
You had the flu shot today Please continue all other medications as before, and refills have been done if requested - the lisinopril at the 20 mg and the concerta Please continue your efforts at being more active, low cholesterol diet, and weight control.  Please remember to sign up for My Chart if you have not done so, as this will be important to you in the future with finding out test results, communicating by private email, and scheduling acute appointments online when needed.  Please return in 6 months, or sooner if needed, with Lab testing done 3-5 days before

## 2013-08-26 ENCOUNTER — Encounter: Payer: Self-pay | Admitting: Internal Medicine

## 2013-08-30 NOTE — Telephone Encounter (Signed)
Zella Ball to notify pharmacy as above -

## 2013-10-03 ENCOUNTER — Encounter: Payer: Self-pay | Admitting: Internal Medicine

## 2013-10-04 MED ORDER — METHYLPHENIDATE HCL ER (OSM) 27 MG PO TBCR: 27.0000 mg | EXTENDED_RELEASE_TABLET | ORAL | Status: DC

## 2013-10-04 MED ORDER — ATORVASTATIN CALCIUM 20 MG PO TABS: 20.0000 mg | ORAL_TABLET | Freq: Every day | ORAL | Status: DC

## 2013-10-04 MED ORDER — LISINOPRIL 20 MG PO TABS: 20.0000 mg | ORAL_TABLET | Freq: Every day | ORAL | Status: DC

## 2013-10-04 NOTE — Telephone Encounter (Signed)
Done hardcopy to robin  Routine rx sent to [facility] as well

## 2013-10-05 NOTE — Telephone Encounter (Signed)
Rx ready, left message on VM 

## 2014-01-20 ENCOUNTER — Encounter: Payer: Self-pay | Admitting: Internal Medicine

## 2014-01-20 MED ORDER — METHYLPHENIDATE HCL ER (OSM) 27 MG PO TBCR: 27.0000 mg | EXTENDED_RELEASE_TABLET | ORAL | Status: DC

## 2014-01-20 NOTE — Telephone Encounter (Signed)
Done hardcopy to robin  

## 2014-01-26 ENCOUNTER — Encounter: Payer: Self-pay | Admitting: Internal Medicine

## 2014-01-26 MED ORDER — KETOCONAZOLE 200 MG PO TABS: 200.0000 mg | ORAL_TABLET | Freq: Two times a day (BID) | ORAL | Status: DC

## 2014-01-26 NOTE — Telephone Encounter (Signed)
Done erx 

## 2014-02-27 ENCOUNTER — Encounter: Payer: Self-pay | Admitting: Internal Medicine

## 2014-02-28 MED ORDER — METHYLPHENIDATE HCL ER (OSM) 27 MG PO TBCR: 27.0000 mg | EXTENDED_RELEASE_TABLET | ORAL | Status: DC

## 2014-02-28 NOTE — Telephone Encounter (Signed)
Done hardcopy to robin  

## 2014-04-11 ENCOUNTER — Encounter: Payer: Self-pay | Admitting: Internal Medicine

## 2014-04-11 MED ORDER — METHYLPHENIDATE HCL ER (OSM) 27 MG PO TBCR: 27.0000 mg | EXTENDED_RELEASE_TABLET | ORAL | Status: DC

## 2014-04-11 NOTE — Telephone Encounter (Signed)
Done hardcopy to robin  

## 2014-06-06 ENCOUNTER — Encounter: Payer: Self-pay | Admitting: Internal Medicine

## 2014-06-06 MED ORDER — METHYLPHENIDATE HCL ER (OSM) 27 MG PO TBCR: 27.0000 mg | EXTENDED_RELEASE_TABLET | ORAL | Status: DC

## 2014-06-06 NOTE — Telephone Encounter (Signed)
Done hardcopy to robin  

## 2014-06-13 ENCOUNTER — Encounter: Payer: Self-pay | Admitting: Internal Medicine

## 2014-06-13 ENCOUNTER — Ambulatory Visit (INDEPENDENT_AMBULATORY_CARE_PROVIDER_SITE_OTHER): Payer: PRIVATE HEALTH INSURANCE | Admitting: Internal Medicine

## 2014-06-13 VITALS — BP 122/80 | HR 67 | Temp 97.7°F | Ht 73.0 in | Wt 220.2 lb

## 2014-06-13 DIAGNOSIS — Z0000 Encounter for general adult medical examination without abnormal findings: Secondary | ICD-10-CM

## 2014-06-13 DIAGNOSIS — I10 Essential (primary) hypertension: Secondary | ICD-10-CM

## 2014-06-13 DIAGNOSIS — E785 Hyperlipidemia, unspecified: Secondary | ICD-10-CM

## 2014-06-13 DIAGNOSIS — R569 Unspecified convulsions: Secondary | ICD-10-CM

## 2014-06-13 MED ORDER — LISINOPRIL 20 MG PO TABS: 20.0000 mg | ORAL_TABLET | Freq: Every day | ORAL | Status: DC

## 2014-06-13 MED ORDER — ATORVASTATIN CALCIUM 20 MG PO TABS: 20.0000 mg | ORAL_TABLET | Freq: Every day | ORAL | Status: AC

## 2014-06-13 NOTE — Assessment & Plan Note (Signed)
BP Readings from Last 3 Encounters:  06/13/14 122/80  07/07/13 160/100  12/27/12 142/100   For cont'd ACE,  to f/u any worsening symptoms or concerns

## 2014-06-13 NOTE — Progress Notes (Signed)
   Subjective:    Patient ID: Kenneth Atkins, male    DOB: 1966-04-14, 48 y.o.   MRN: [id number]  HPI  Here to f/u; overall doing ok,  Pt denies chest pain, increased sob or doe, wheezing, orthopnea, PND, increased LE swelling, palpitations, dizziness or syncope.  Pt denies polydipsia, polyuria, or low sugar symptoms such as weakness or confusion improved with po intake.  Pt denies new neurological symptoms such as new headache, or facial or extremity weakness or numbness.   Pt states overall good compliance with meds, has been trying to follow lower cholesterol, diet, with wt overall stable,  but little exercise however.  Off valproic acid per neurology.  Out of other meds for several wks, needs refills. Changing jobs, does not want labs with his current insurance as not sure is coverred Past Medical History  Diagnosis Date  . Hypertension   . Hyperlipidemia   . Seizure disorder     None since 2001  . Attention deficit disorder of adult 12/12/2008    Qualifier: Diagnosis of  By: Jenny Reichmann MD, Hunt Oris    Past Surgical History  Procedure Laterality Date  . Cholecystectomy      reports that he has been smoking.  He does not have any smokeless tobacco history on file. He reports that he drinks about 7.2 ounces of alcohol per week. He reports that he does not use illicit drugs. family history includes Alcohol abuse in his father; Hypertension in his brother and father. No Known Allergies Current Outpatient Prescriptions on File Prior to Visit  Medication Sig Dispense Refill  . ketoconazole (NIZORAL) 200 MG tablet Take 1 tablet (200 mg total) by mouth 2 (two) times daily.  14 tablet  0  . methylphenidate (CONCERTA) 27 MG PO CR tablet Take 1 tablet (27 mg total) by mouth every morning.  30 tablet  0   No current facility-administered medications on file prior to visit.   Review of Systems All otherwise neg per pt     Objective:   Physical Exam BP 122/80  Pulse 67  Temp(Src) 97.7 F (36.5 C)  (Oral)  Ht 6\' 1"  (1.854 m)  Wt 220 lb 4 oz (99.905 kg)  BMI 29.06 kg/m2  SpO2 95% VS noted,  Constitutional: Pt appears well-developed, well-nourished.  HENT: Head: NCAT.  Right Ear: External ear normal.  Left Ear: External ear normal.  Eyes: . Pupils are equal, round, and reactive to light. Conjunctivae and EOM are normal Neck: Normal range of motion. Neck supple.  Cardiovascular: Normal rate and regular rhythm.   Pulmonary/Chest: Effort normal and breath sounds normal.  Abd:  Soft, NT, ND, + BS Neurological: Pt is alert. Not confused , motor grossly intact Skin: Skin is warm. No rash Psychiatric: Pt behavior is normal. No agitation.     Assessment & Plan:

## 2014-06-13 NOTE — Progress Notes (Signed)
Pre visit review using our clinic review tool, if applicable. No additional management support is needed unless otherwise documented below in the visit note. 

## 2014-06-13 NOTE — Assessment & Plan Note (Signed)
No further sz recent,  to f/u any worsening symptoms or concerns

## 2014-06-13 NOTE — Assessment & Plan Note (Signed)
stable overall by history and exam, recent data reviewed with pt, and pt to continue medical treatment as before,  to f/u any worsening symptoms or concerns Lab Results  Component Value Date   CHOL 196 12/27/2012   HDL 33.90* 12/27/2012   LDLDIRECT 136.5 12/27/2012   TRIG 222.0* 12/27/2012   CHOLHDL 6 12/27/2012   For re-start lipitor

## 2014-06-13 NOTE — Patient Instructions (Signed)
Please continue all other medications as before, and refills have been done if requested.  Please have the pharmacy call with any other refills you may need.  Please keep your appointments with your specialists as you may have planned  Please return in 6 months, or sooner if needed, with Lab testing done 3-5 days before  

## 2014-07-20 ENCOUNTER — Encounter: Payer: Self-pay | Admitting: Internal Medicine

## 2014-07-20 ENCOUNTER — Telehealth: Payer: Self-pay | Admitting: Internal Medicine

## 2014-07-20 MED ORDER — METHYLPHENIDATE HCL ER (OSM) 27 MG PO TBCR: 27.0000 mg | EXTENDED_RELEASE_TABLET | ORAL | Status: DC

## 2014-07-20 NOTE — Telephone Encounter (Signed)
Done hardcopy to robin  

## 2014-07-21 NOTE — Telephone Encounter (Signed)
Called the patient left a detailed message hardcopy for concerta is ready for pickup at the front desk.

## 2014-09-08 ENCOUNTER — Encounter: Payer: Self-pay | Admitting: Internal Medicine

## 2014-09-08 ENCOUNTER — Telehealth: Payer: Self-pay

## 2014-09-08 MED ORDER — METHYLPHENIDATE HCL ER (OSM) 27 MG PO TBCR: 27.0000 mg | EXTENDED_RELEASE_TABLET | ORAL | Status: DC

## 2014-09-08 NOTE — Telephone Encounter (Signed)
Called the patient informed hardcopy for concerta is at the front desk for pickup.

## 2014-09-08 NOTE — Telephone Encounter (Signed)
Done hardcopy to robin  

## 2014-12-26 ENCOUNTER — Encounter: Payer: Self-pay | Admitting: Internal Medicine

## 2014-12-26 MED ORDER — METHYLPHENIDATE HCL ER (OSM) 27 MG PO TBCR: 27.0000 mg | EXTENDED_RELEASE_TABLET | ORAL | Status: DC

## 2014-12-26 NOTE — Telephone Encounter (Signed)
Done hardcopy to cherina 

## 2014-12-27 NOTE — Telephone Encounter (Signed)
Prescription is at the front desk ready for pickup.

## 2015-02-09 ENCOUNTER — Encounter: Payer: Self-pay | Admitting: Internal Medicine

## 2015-02-09 MED ORDER — METHYLPHENIDATE HCL ER (OSM) 27 MG PO TBCR: 27.0000 mg | EXTENDED_RELEASE_TABLET | ORAL | Status: DC

## 2015-02-09 NOTE — Telephone Encounter (Signed)
Done hardcopy to steph 

## 2015-02-09 NOTE — Telephone Encounter (Signed)
Place in cabinet for pick-up...Kenneth Atkins

## 2015-03-14 ENCOUNTER — Encounter: Payer: Self-pay | Admitting: Internal Medicine

## 2015-03-15 MED ORDER — METHYLPHENIDATE HCL ER (OSM) 27 MG PO TBCR: 27.0000 mg | EXTENDED_RELEASE_TABLET | ORAL | Status: DC

## 2015-03-15 NOTE — Telephone Encounter (Signed)
Done hardcopy to Dahlia  

## 2015-04-26 ENCOUNTER — Encounter: Payer: Self-pay | Admitting: Internal Medicine

## 2015-04-26 MED ORDER — METHYLPHENIDATE HCL ER (OSM) 27 MG PO TBCR: 27.0000 mg | EXTENDED_RELEASE_TABLET | ORAL | Status: DC

## 2015-04-26 NOTE — Telephone Encounter (Signed)
Pt informed, Rx in cabinet for pt pick up  

## 2015-04-26 NOTE — Telephone Encounter (Signed)
Done hardcopy to Dahlia  

## 2015-05-27 ENCOUNTER — Encounter: Payer: Self-pay | Admitting: Internal Medicine

## 2015-05-29 MED ORDER — METHYLPHENIDATE HCL ER (OSM) 27 MG PO TBCR: 27.0000 mg | EXTENDED_RELEASE_TABLET | ORAL | Status: DC

## 2015-05-29 NOTE — Telephone Encounter (Signed)
Done hardcopy to Dahlia  

## 2015-10-05 ENCOUNTER — Other Ambulatory Visit (INDEPENDENT_AMBULATORY_CARE_PROVIDER_SITE_OTHER): Payer: BLUE CROSS/BLUE SHIELD

## 2015-10-05 ENCOUNTER — Encounter: Payer: Self-pay | Admitting: Internal Medicine

## 2015-10-05 ENCOUNTER — Ambulatory Visit (INDEPENDENT_AMBULATORY_CARE_PROVIDER_SITE_OTHER): Payer: BLUE CROSS/BLUE SHIELD | Admitting: Internal Medicine

## 2015-10-05 VITALS — BP 150/110 | HR 86 | Temp 98.0°F | Ht 72.0 in | Wt 227.0 lb

## 2015-10-05 DIAGNOSIS — R7989 Other specified abnormal findings of blood chemistry: Secondary | ICD-10-CM

## 2015-10-05 DIAGNOSIS — F988 Other specified behavioral and emotional disorders with onset usually occurring in childhood and adolescence: Secondary | ICD-10-CM

## 2015-10-05 DIAGNOSIS — I10 Essential (primary) hypertension: Secondary | ICD-10-CM

## 2015-10-05 DIAGNOSIS — F900 Attention-deficit hyperactivity disorder, predominantly inattentive type: Secondary | ICD-10-CM | POA: Diagnosis not present

## 2015-10-05 DIAGNOSIS — Z0001 Encounter for general adult medical examination with abnormal findings: Secondary | ICD-10-CM | POA: Diagnosis not present

## 2015-10-05 DIAGNOSIS — Z0000 Encounter for general adult medical examination without abnormal findings: Secondary | ICD-10-CM | POA: Diagnosis not present

## 2015-10-05 DIAGNOSIS — R0683 Snoring: Secondary | ICD-10-CM | POA: Diagnosis not present

## 2015-10-05 DIAGNOSIS — J309 Allergic rhinitis, unspecified: Secondary | ICD-10-CM | POA: Diagnosis not present

## 2015-10-05 LAB — CBC WITH DIFFERENTIAL/PLATELET
BASOS PCT: 1.3 % (ref 0.0–3.0)
Basophils Absolute: 0.1 10*3/uL (ref 0.0–0.1)
EOS ABS: 0.3 10*3/uL (ref 0.0–0.7)
Eosinophils Relative: 3.4 % (ref 0.0–5.0)
HEMATOCRIT: 48 % (ref 39.0–52.0)
Hemoglobin: 16.4 g/dL (ref 13.0–17.0)
Lymphocytes Relative: 30.8 % (ref 12.0–46.0)
Lymphs Abs: 2.7 10*3/uL (ref 0.7–4.0)
MCHC: 34.2 g/dL (ref 30.0–36.0)
MCV: 88.7 fl (ref 78.0–100.0)
MONO ABS: 0.7 10*3/uL (ref 0.1–1.0)
Monocytes Relative: 8.1 % (ref 3.0–12.0)
NEUTROS ABS: 5 10*3/uL (ref 1.4–7.7)
Neutrophils Relative %: 56.4 % (ref 43.0–77.0)
PLATELETS: 253 10*3/uL (ref 150.0–400.0)
RBC: 5.42 Mil/uL (ref 4.22–5.81)
RDW: 13.1 % (ref 11.5–15.5)
WBC: 8.8 10*3/uL (ref 4.0–10.5)

## 2015-10-05 LAB — BASIC METABOLIC PANEL
BUN: 14 mg/dL (ref 6–23)
CHLORIDE: 104 meq/L (ref 96–112)
CO2: 29 mEq/L (ref 19–32)
Calcium: 9.6 mg/dL (ref 8.4–10.5)
Creatinine, Ser: 0.76 mg/dL (ref 0.40–1.50)
GFR: 116.67 mL/min (ref >60.00)
Glucose, Bld: 91 mg/dL (ref 70–99)
POTASSIUM: 4.5 meq/L (ref 3.5–5.1)
SODIUM: 140 meq/L (ref 135–145)

## 2015-10-05 LAB — URINALYSIS, ROUTINE W REFLEX MICROSCOPIC
Bilirubin Urine: NEGATIVE
Hgb urine dipstick: NEGATIVE
Ketones, ur: NEGATIVE
Leukocytes, UA: NEGATIVE
Nitrite: NEGATIVE
RBC / HPF: NONE SEEN (ref 0–?)
SPECIFIC GRAVITY, URINE: 1.025 (ref 1.000–1.030)
Total Protein, Urine: NEGATIVE
URINE GLUCOSE: NEGATIVE
Urobilinogen, UA: 0.2 (ref 0.0–1.0)
WBC UA: NONE SEEN (ref 0–?)
pH: 6 (ref 5.0–8.0)

## 2015-10-05 LAB — HEPATIC FUNCTION PANEL
ALT: 88 U/L — AB (ref 0–53)
AST: 38 U/L — AB (ref 0–37)
Albumin: 4.5 g/dL (ref 3.5–5.2)
Alkaline Phosphatase: 73 U/L (ref 39–117)
BILIRUBIN DIRECT: 0.1 mg/dL (ref 0.0–0.3)
BILIRUBIN TOTAL: 0.7 mg/dL (ref 0.2–1.2)
Total Protein: 7.1 g/dL (ref 6.0–8.3)

## 2015-10-05 LAB — LIPID PANEL
CHOL/HDL RATIO: 7
Cholesterol: 257 mg/dL — ABNORMAL HIGH (ref 0–200)
HDL: 36.8 mg/dL — ABNORMAL LOW (ref >39.00)
NONHDL: 220.48
Triglycerides: 236 mg/dL — ABNORMAL HIGH (ref 0.0–149.0)
VLDL: 47.2 mg/dL — ABNORMAL HIGH (ref 0.0–40.0)

## 2015-10-05 LAB — TSH: TSH: 0.95 u[IU]/mL (ref 0.35–4.50)

## 2015-10-05 LAB — PSA: PSA: 0.26 ng/mL (ref 0.10–4.00)

## 2015-10-05 LAB — LDL CHOLESTEROL, DIRECT: LDL DIRECT: 193 mg/dL

## 2015-10-05 MED ORDER — LISDEXAMFETAMINE DIMESYLATE 30 MG PO CAPS: 30.0000 mg | ORAL_CAPSULE | Freq: Every day | ORAL | Status: DC

## 2015-10-05 MED ORDER — TRIAMCINOLONE ACETONIDE 55 MCG/ACT NA AERO: 2.0000 | INHALATION_SPRAY | Freq: Every day | NASAL | Status: DC

## 2015-10-05 MED ORDER — CETIRIZINE HCL 10 MG PO TABS: 10.0000 mg | ORAL_TABLET | Freq: Every day | ORAL | Status: DC

## 2015-10-05 MED ORDER — LISINOPRIL 20 MG PO TABS: 20.0000 mg | ORAL_TABLET | Freq: Every day | ORAL | Status: DC

## 2015-10-05 NOTE — Assessment & Plan Note (Signed)
[facility] for change back from the concerta to vyvanse 30 qd,  to f/u any worsening symptoms or concerns

## 2015-10-05 NOTE — Assessment & Plan Note (Addendum)
For re-start meds, f/u BP at home and next visit, o/w stable overall by history and exam, recent data reviewed with pt, and pt to continue medical treatment as before,  to f/u any worsening symptoms or concerns  BP Readings from Last 3 Encounters:  10/05/15 150/110  06/13/14 122/80  07/07/13 160/100

## 2015-10-05 NOTE — Assessment & Plan Note (Signed)
etoilogy unclear, also for ENT referral

## 2015-10-05 NOTE — Assessment & Plan Note (Signed)
For zyrtec/nasacort asd,  to f/u any worsening symptoms or concerns

## 2015-10-05 NOTE — Progress Notes (Signed)
Pre visit review using our clinic review tool, if applicable. No additional management support is needed unless otherwise documented below in the visit note. 

## 2015-10-05 NOTE — Patient Instructions (Addendum)
Please take all new medication as prescribed - the zyrtec and nasacort for congestion  OK to change the concerta to vyvanse  Please continue all other medications as before, and refills have been done if requested - the blood pressure medication  Please have the pharmacy call with any other refills you may need.  Please continue your efforts at being more active, low cholesterol diet, and weight control.  You are otherwise up to date with prevention measures today.  You will be contacted regarding the referral for: ENT  Please keep your appointments with your specialists as you may have planned  Please go to the LAB in the Basement (turn left off the elevator) for the tests to be done today  You will be contacted by phone if any changes need to be made immediately.  Otherwise, you will receive a letter about your results with an explanation, but please check with MyChart first.  Please remember to sign up for MyChart if you have not done so, as this will be important to you in the future with finding out test results, communicating by private email, and scheduling acute appointments online when needed.  Please return in 1 year for your yearly visit, or sooner if needed, with Lab testing done 3-5 days before

## 2015-10-05 NOTE — Assessment & Plan Note (Signed)

## 2015-10-05 NOTE — Progress Notes (Signed)
Subjective:    Patient ID: Kenneth Atkins, male    DOB: 1966-07-29, 50 y.o.   MRN: [id number]  HPI  Here for wellness and f/u;  Overall doing ok;  Pt denies Chest pain, worsening SOB, DOE, wheezing, orthopnea, PND, worsening LE edema, palpitations, dizziness or syncope.  Pt denies neurological change such as new headache, facial or extremity weakness.  Pt denies polydipsia, polyuria, or low sugar symptoms. Pt states overall good compliance with treatment and medications, good tolerability, and has been trying to follow appropriate diet.  Pt denies worsening depressive symptoms, suicidal ideation or panic. No fever, night sweats, wt loss, loss of appetite, or other constitutional symptoms.  Pt states good ability with ADL's, has low fall risk, home safety reviewed and adequate, no other significant changes in hearing or vision, and only occasionally active with exercise.  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 Does have several wks ongoing nasal allergy symptoms with clearish congestion, itch and sneezing, without fever, pain, ST, cough, swelling or wheezing.  Akss for swtich concerta back to the vyvanse as he has different insurance and will cover now again. Declines tetanus.  Needs med refill, out recently of all.  Past Medical History  Diagnosis Date  . Hypertension   . Hyperlipidemia   . Seizure disorder ([place])     None since 2001  . Attention deficit disorder of adult 12/12/2008    Qualifier: Diagnosis of  By: Jenny Reichmann MD, Hunt Oris    Past Surgical History  Procedure Laterality Date  . Cholecystectomy      reports that he has been smoking.  He does not have any smokeless tobacco history on file. He reports that he drinks about 7.2 oz of alcohol per week. He reports that he does not use illicit drugs. family history includes Alcohol abuse in his father; Hypertension in his brother and father. No Known Allergies Current  Outpatient Prescriptions on File Prior to Visit  Medication Sig Dispense Refill  . ketoconazole (NIZORAL) 200 MG tablet Take 1 tablet (200 mg total) by mouth 2 (two) times daily. 14 tablet 0  . methylphenidate (CONCERTA) 27 MG PO CR tablet Take 1 tablet (27 mg total) by mouth every morning. 30 tablet 0   No current facility-administered medications on file prior to visit.     Review of Systems Constitutional: Negative for increased diaphoresis, other activity, appetite or siginficant weight change other than noted HENT: Negative for worsening hearing loss, ear pain, facial swelling, mouth sores and neck stiffness.   Eyes: Negative for other worsening pain, redness or visual disturbance.  Respiratory: Negative for shortness of breath and wheezing  Cardiovascular: Negative for chest pain and palpitations.  Gastrointestinal: Negative for diarrhea, blood in stool, abdominal distention or other pain Genitourinary: Negative for hematuria, flank pain or change in urine volume.  Musculoskeletal: Negative for myalgias or other joint complaints.  Skin: Negative for color change and wound or drainage.  Neurological: Negative for syncope and numbness. other than noted Hematological: Negative for adenopathy. or other swelling Psychiatric/Behavioral: Negative for hallucinations, SI, self-injury, decreased concentration or other worsening agitation.      Objective:   Physical Exam BP 150/110 mmHg  Pulse 86  Temp(Src) 98 F (36.7 C) (Oral)  Ht 6' (1.829 m)  Wt 227 lb (102.967 kg)  BMI 30.78 kg/m2  SpO2 96% VS noted,  Constitutional: Pt is oriented to person, place, and time. Appears well-developed and well-nourished,  in no significant distress Head: Normocephalic and atraumatic.  Right Ear: External ear normal.  Left Ear: External ear normal.  Nose: Nose normal.  Mouth/Throat: Oropharynx is clear and moist.  Eyes: Conjunctivae and EOM are normal. Pupils are equal, round, and reactive to  light.  Neck: Normal range of motion. Neck supple. No JVD present. No tracheal deviation present or significant neck LA or mass Cardiovascular: Normal rate, regular rhythm, normal heart sounds and intact distal pulses.   Pulmonary/Chest: Effort normal and breath sounds without rales or wheezing  Abdominal: Soft. Bowel sounds are normal. NT. No HSM  Musculoskeletal: Normal range of motion. Exhibits no edema.  Lymphadenopathy:  Has no cervical adenopathy.  Neurological: Pt is alert and oriented to person, place, and time. Pt has normal reflexes. No cranial nerve deficit. Motor grossly intact Skin: Skin is warm and dry. No rash noted.  Psychiatric:  Has normal mood and affect. Behavior is normal.      Assessment & Plan:

## 2015-10-06 ENCOUNTER — Other Ambulatory Visit: Payer: Self-pay | Admitting: Internal Medicine

## 2015-10-06 MED ORDER — ROSUVASTATIN CALCIUM 20 MG PO TABS: 20.0000 mg | ORAL_TABLET | Freq: Every day | ORAL | Status: DC

## 2015-10-12 ENCOUNTER — Telehealth: Payer: Self-pay

## 2015-10-12 NOTE — Telephone Encounter (Signed)
Tried to complete PA for patients medication vyvanse. Need patients new insurance information.

## 2015-10-19 NOTE — Telephone Encounter (Signed)
Pt is following up on the PA for his medication.  Not sure what ins info that is needed

## 2015-10-19 NOTE — Telephone Encounter (Signed)
Encounter below: patient can be reached at [phone]

## 2015-10-24 NOTE — Telephone Encounter (Signed)
Group #[id number] ID# [id number] Bin# [id number]

## 2015-10-29 NOTE — Telephone Encounter (Signed)
Pt aware of PA status

## 2015-10-29 NOTE — Telephone Encounter (Signed)
Patient is following up on this. States that he has not gotten any kind of response and that insurance company has not gotten the PA request. Please follow up

## 2015-10-29 NOTE — Telephone Encounter (Signed)
Please call the patient to let him know that we have submitted the PA./

## 2015-10-29 NOTE — Telephone Encounter (Signed)
PA for Vyvanse initiated via CoverMyMeds, Key - [id number]. Left message on machine to advised pt

## 2015-10-30 NOTE — Telephone Encounter (Signed)
PA approved 10/29/2015 - 09/28/2038. Pt advised via VM

## 2016-01-30 ENCOUNTER — Other Ambulatory Visit: Payer: Self-pay | Admitting: *Deleted

## 2016-01-30 MED ORDER — KETOCONAZOLE 200 MG PO TABS: 200.0000 mg | ORAL_TABLET | Freq: Two times a day (BID) | ORAL | Status: DC

## 2016-01-30 NOTE — Telephone Encounter (Signed)
Received call pt is needing refill on the Ketonazole tab (anti-fungal)...Kenneth Atkins

## 2016-02-07 ENCOUNTER — Telehealth: Payer: Self-pay | Admitting: *Deleted

## 2016-02-07 MED ORDER — LISDEXAMFETAMINE DIMESYLATE 30 MG PO CAPS: 30.0000 mg | ORAL_CAPSULE | Freq: Every day | ORAL | Status: DC

## 2016-02-07 NOTE — Telephone Encounter (Signed)
Notified pt rx's ready for pick-up../lmb 

## 2016-02-07 NOTE — Telephone Encounter (Signed)
Done hardcopy to Corinne  

## 2016-02-07 NOTE — Telephone Encounter (Signed)
Receive call pt is requesting 3 months scripts on his Vyvanse...Kenneth Atkins

## 2016-05-26 ENCOUNTER — Telehealth: Payer: Self-pay | Admitting: *Deleted

## 2016-05-26 NOTE — Telephone Encounter (Signed)
Left msg on triage requesting refill on his Vyvanse. MD is out of the office today will hold until he return tomorrow....[name]

## 2016-05-27 MED ORDER — LISDEXAMFETAMINE DIMESYLATE 30 MG PO CAPS: 30.0000 mg | ORAL_CAPSULE | Freq: Every day | ORAL | 0 refills | Status: DC

## 2016-05-27 NOTE — Telephone Encounter (Signed)
Notified pt rx ready for pick-up.../lmb 

## 2016-05-27 NOTE — Telephone Encounter (Signed)
Done hardcopy to Corinne  

## 2016-06-27 ENCOUNTER — Telehealth: Payer: Self-pay | Admitting: *Deleted

## 2016-06-27 NOTE — Telephone Encounter (Signed)
Rec'd call pt requesting refill on his Vyvanse. MD out of office until Tues pls advise on refill...Kenneth Atkins

## 2016-06-27 NOTE — Telephone Encounter (Signed)
I did not get a chance to fill this prior to leaving will require re-forward to another provider.

## 2016-06-30 MED ORDER — LISDEXAMFETAMINE DIMESYLATE 30 MG PO CAPS: 30.0000 mg | ORAL_CAPSULE | Freq: Every day | ORAL | 0 refills | Status: DC

## 2016-06-30 NOTE — Telephone Encounter (Signed)
Notified pt rx ready for pick-up.../lmb 

## 2016-06-30 NOTE — Addendum Note (Signed)
Addended by: Earnstine Regal on: 06/30/2016 02:47 PM   Modules accepted: Orders

## 2016-06-30 NOTE — Telephone Encounter (Signed)
Ok to fill 

## 2016-07-31 ENCOUNTER — Telehealth: Payer: Self-pay | Admitting: *Deleted

## 2016-07-31 MED ORDER — LISDEXAMFETAMINE DIMESYLATE 30 MG PO CAPS: 30.0000 mg | ORAL_CAPSULE | Freq: Every day | ORAL | 0 refills | Status: DC

## 2016-07-31 NOTE — Telephone Encounter (Signed)
Rec'd call pt is requesting refill on his Vyvanse...Kenneth Atkins

## 2016-07-31 NOTE — Telephone Encounter (Signed)
Notified pt rx ready for pick-up.../lmb 

## 2016-07-31 NOTE — Telephone Encounter (Signed)
Done hardcopy to Corinne  

## 2016-09-04 ENCOUNTER — Telehealth: Payer: Self-pay | Admitting: *Deleted

## 2016-09-04 MED ORDER — LISDEXAMFETAMINE DIMESYLATE 30 MG PO CAPS: 30.0000 mg | ORAL_CAPSULE | Freq: Every day | ORAL | 0 refills | Status: DC

## 2016-09-04 NOTE — Telephone Encounter (Signed)
Rec'd call pt requesting refill on Vyvanse...Kenneth Atkins

## 2016-09-04 NOTE — Telephone Encounter (Signed)
Left patient message advising medication was ready for pick up

## 2016-09-04 NOTE — Telephone Encounter (Signed)
Done hardcopy to Corinne  Pt to make ROV after jan 1 for furhter refills please

## 2016-10-08 ENCOUNTER — Telehealth: Payer: Self-pay | Admitting: *Deleted

## 2016-10-08 MED ORDER — LISDEXAMFETAMINE DIMESYLATE 30 MG PO CAPS: 30.0000 mg | ORAL_CAPSULE | Freq: Every day | ORAL | 0 refills | Status: DC

## 2016-10-08 NOTE — Telephone Encounter (Signed)
Pt left msg on triage requesting refill on his Vyvanse...Kenneth Atkins

## 2016-10-08 NOTE — Telephone Encounter (Signed)
Done hardcopy to Corinne  Please let pt know - please make ROV for further refills

## 2016-10-09 NOTE — Telephone Encounter (Signed)
Placed up front for patient pick up. Left message on patients phone.

## 2016-11-07 ENCOUNTER — Telehealth: Payer: Self-pay | Admitting: *Deleted

## 2016-11-07 MED ORDER — LISDEXAMFETAMINE DIMESYLATE 30 MG PO CAPS: 30.0000 mg | ORAL_CAPSULE | Freq: Every day | ORAL | 0 refills | Status: DC

## 2016-11-07 NOTE — Telephone Encounter (Signed)
Left msg on triage requesting refill on his Vyvanse. MD out of office pls advise...Kenneth Atkins

## 2016-11-07 NOTE — Telephone Encounter (Signed)
Notified pt rx ready for pick-up.../lmb 

## 2016-11-07 NOTE — Telephone Encounter (Signed)
[place] controlled substance database checked.  Ok to fill medication.  

## 2016-12-10 ENCOUNTER — Telehealth: Payer: Self-pay | Admitting: Internal Medicine

## 2016-12-10 ENCOUNTER — Telehealth: Payer: Self-pay | Admitting: *Deleted

## 2016-12-10 MED ORDER — LISDEXAMFETAMINE DIMESYLATE 30 MG PO CAPS: 30.0000 mg | ORAL_CAPSULE | Freq: Every day | ORAL | 0 refills | Status: DC

## 2016-12-10 MED ORDER — KETOCONAZOLE 200 MG PO TABS: 200.0000 mg | ORAL_TABLET | Freq: Two times a day (BID) | ORAL | 0 refills | Status: DC

## 2016-12-10 NOTE — Telephone Encounter (Signed)
[place] for both;  Teacher, English as a foreign language to Kenneth Atkins  Please inform pt I will not be able to do further refills until ROV due to office policy as pt was last seenb jan 2017

## 2016-12-10 NOTE — Telephone Encounter (Signed)
Left msg on triage requesting refill on his Vyvanse & Ketoconazole...Kenneth Atkins

## 2016-12-10 NOTE — Telephone Encounter (Signed)
Called pt and LVM about Rx. [facility] is ready to be pick up front desk

## 2016-12-10 NOTE — Telephone Encounter (Signed)
Called pt back and LVM about needing OV for futher refill on medication and also left RX front to be pick up

## 2016-12-13 ENCOUNTER — Other Ambulatory Visit: Payer: Self-pay | Admitting: Internal Medicine

## 2016-12-29 ENCOUNTER — Other Ambulatory Visit: Payer: Self-pay | Admitting: Internal Medicine

## 2016-12-31 ENCOUNTER — Other Ambulatory Visit (INDEPENDENT_AMBULATORY_CARE_PROVIDER_SITE_OTHER): Payer: BLUE CROSS/BLUE SHIELD

## 2016-12-31 ENCOUNTER — Other Ambulatory Visit: Payer: Self-pay | Admitting: Internal Medicine

## 2016-12-31 ENCOUNTER — Encounter: Payer: Self-pay | Admitting: Internal Medicine

## 2016-12-31 ENCOUNTER — Ambulatory Visit (INDEPENDENT_AMBULATORY_CARE_PROVIDER_SITE_OTHER): Payer: BLUE CROSS/BLUE SHIELD | Admitting: Internal Medicine

## 2016-12-31 VITALS — BP 142/100 | HR 88 | Temp 98.4°F | Ht 73.0 in | Wt 223.0 lb

## 2016-12-31 DIAGNOSIS — Z23 Encounter for immunization: Secondary | ICD-10-CM

## 2016-12-31 DIAGNOSIS — R21 Rash and other nonspecific skin eruption: Secondary | ICD-10-CM | POA: Diagnosis not present

## 2016-12-31 DIAGNOSIS — Z0000 Encounter for general adult medical examination without abnormal findings: Secondary | ICD-10-CM

## 2016-12-31 DIAGNOSIS — F988 Other specified behavioral and emotional disorders with onset usually occurring in childhood and adolescence: Secondary | ICD-10-CM | POA: Diagnosis not present

## 2016-12-31 DIAGNOSIS — Z87891 Personal history of nicotine dependence: Secondary | ICD-10-CM | POA: Diagnosis not present

## 2016-12-31 DIAGNOSIS — E785 Hyperlipidemia, unspecified: Secondary | ICD-10-CM | POA: Diagnosis not present

## 2016-12-31 DIAGNOSIS — I10 Essential (primary) hypertension: Secondary | ICD-10-CM | POA: Diagnosis not present

## 2016-12-31 LAB — LIPID PANEL
CHOL/HDL RATIO: 5
Cholesterol: 205 mg/dL — ABNORMAL HIGH (ref 0–200)
HDL: 41.8 mg/dL (ref >39.00)
LDL Cholesterol: 124 mg/dL — ABNORMAL HIGH (ref 0–99)
NonHDL: 163.52
TRIGLYCERIDES: 196 mg/dL — AB (ref 0.0–149.0)
VLDL: 39.2 mg/dL (ref 0.0–40.0)

## 2016-12-31 LAB — URINALYSIS, ROUTINE W REFLEX MICROSCOPIC
BILIRUBIN URINE: NEGATIVE
HGB URINE DIPSTICK: NEGATIVE
KETONES UR: NEGATIVE
LEUKOCYTES UA: NEGATIVE
NITRITE: NEGATIVE
RBC / HPF: NONE SEEN (ref 0–?)
Specific Gravity, Urine: 1.015 (ref 1.000–1.030)
Total Protein, Urine: NEGATIVE
UROBILINOGEN UA: 0.2 (ref 0.0–1.0)
Urine Glucose: NEGATIVE
WBC UA: NONE SEEN (ref 0–?)
pH: 6 (ref 5.0–8.0)

## 2016-12-31 LAB — CBC WITH DIFFERENTIAL/PLATELET
BASOS ABS: 0.1 10*3/uL (ref 0.0–0.1)
Basophils Relative: 1.2 % (ref 0.0–3.0)
Eosinophils Absolute: 0.2 10*3/uL (ref 0.0–0.7)
Eosinophils Relative: 2.1 % (ref 0.0–5.0)
HEMATOCRIT: 46.5 % (ref 39.0–52.0)
HEMOGLOBIN: 15.9 g/dL (ref 13.0–17.0)
LYMPHS PCT: 28.1 % (ref 12.0–46.0)
Lymphs Abs: 2.3 10*3/uL (ref 0.7–4.0)
MCHC: 34.2 g/dL (ref 30.0–36.0)
MCV: 89 fl (ref 78.0–100.0)
MONOS PCT: 6.8 % (ref 3.0–12.0)
Monocytes Absolute: 0.6 10*3/uL (ref 0.1–1.0)
NEUTROS ABS: 5.2 10*3/uL (ref 1.4–7.7)
Neutrophils Relative %: 61.8 % (ref 43.0–77.0)
PLATELETS: 257 10*3/uL (ref 150.0–400.0)
RBC: 5.23 Mil/uL (ref 4.22–5.81)
RDW: 12.9 % (ref 11.5–15.5)
WBC: 8.3 10*3/uL (ref 4.0–10.5)

## 2016-12-31 LAB — BASIC METABOLIC PANEL
BUN: 12 mg/dL (ref 6–23)
CALCIUM: 10 mg/dL (ref 8.4–10.5)
CO2: 29 mEq/L (ref 19–32)
CREATININE: 0.8 mg/dL (ref 0.40–1.50)
Chloride: 105 mEq/L (ref 96–112)
GFR: 109.39 mL/min (ref >60.00)
Glucose, Bld: 97 mg/dL (ref 70–99)
Potassium: 4.9 mEq/L (ref 3.5–5.1)
Sodium: 141 mEq/L (ref 135–145)

## 2016-12-31 LAB — TSH: TSH: 1.49 u[IU]/mL (ref 0.35–4.50)

## 2016-12-31 LAB — HEPATIC FUNCTION PANEL
ALT: 47 U/L (ref 0–53)
AST: 25 U/L (ref 0–37)
Albumin: 4.8 g/dL (ref 3.5–5.2)
Alkaline Phosphatase: 62 U/L (ref 39–117)
BILIRUBIN DIRECT: 0.1 mg/dL (ref 0.0–0.3)
Total Bilirubin: 0.6 mg/dL (ref 0.2–1.2)
Total Protein: 7.5 g/dL (ref 6.0–8.3)

## 2016-12-31 LAB — HIV ANTIBODY (ROUTINE TESTING W REFLEX): HIV: NONREACTIVE

## 2016-12-31 LAB — PSA: PSA: 0.29 ng/mL (ref 0.10–4.00)

## 2016-12-31 MED ORDER — ROSUVASTATIN CALCIUM 20 MG PO TABS: 20.0000 mg | ORAL_TABLET | Freq: Every day | ORAL | 3 refills | Status: DC

## 2016-12-31 MED ORDER — METRONIDAZOLE 0.75 % EX CREA: TOPICAL_CREAM | Freq: Two times a day (BID) | CUTANEOUS | 1 refills | Status: DC

## 2016-12-31 MED ORDER — METHYLPHENIDATE HCL ER (OSM) 36 MG PO TBCR: 36.0000 mg | EXTENDED_RELEASE_TABLET | Freq: Every day | ORAL | 0 refills | Status: DC

## 2016-12-31 MED ORDER — LISINOPRIL 20 MG PO TABS
20.0000 mg | ORAL_TABLET | Freq: Every day | ORAL | 3 refills | Status: DC
Start: 2016-12-31 — End: 2018-02-10

## 2016-12-31 MED ORDER — ROSUVASTATIN CALCIUM 40 MG PO TABS: 40.0000 mg | ORAL_TABLET | Freq: Every day | ORAL | 3 refills | Status: DC

## 2016-12-31 NOTE — Patient Instructions (Signed)
You had the Tdap tetanus shot today  OK to stop the vyvanse  Please take all new medication as prescribed - the concerta (you are given 3 prescriptions today which are dated)  Please take all new medication as prescribed- the topical metromidazole to try for the face  Please continue all other medications as before, and refills have been done if requested.  Please have the pharmacy call with any other refills you may need.  Please continue your efforts at being more active, low cholesterol diet, and weight control.  You are otherwise up to date with prevention measures today.  Please keep your appointments with your specialists as you may have planned  Please go to the LAB in the Basement (turn left off the elevator) for the tests to be done today  You will be contacted by phone if any changes need to be made immediately.  Otherwise, you will receive a letter about your results with an explanation, but please check with MyChart first.  Please remember to sign up for MyChart if you have not done so, as this will be important to you in the future with finding out test results, communicating by private email, and scheduling acute appointments online when needed.  Please return in 1 year for your yearly visit, or sooner if needed, with Lab testing done 3-5 days before

## 2016-12-31 NOTE — Progress Notes (Signed)
Pre visit review using our clinic review tool, if applicable. No additional management support is needed unless otherwise documented below in the visit note. 

## 2016-12-31 NOTE — Progress Notes (Signed)
Subjective:    Patient ID: Kenneth Atkins, male    DOB: 28-Nov-1965, 51 y.o.   MRN: [id number]  HPI Here for wellness and f/u;  Overall doing ok;  Pt denies Chest pain, worsening SOB, DOE, wheezing, orthopnea, PND, worsening LE edema, palpitations, dizziness or syncope.  Pt denies neurological change such as new headache, facial or extremity weakness.  Pt denies polydipsia, polyuria, or low sugar symptoms. Pt states overall good compliance with treatment and medications, good tolerability, and has been trying to follow appropriate diet.  Pt denies worsening depressive symptoms, suicidal ideation or panic. No fever, night sweats, wt loss, loss of appetite, or other constitutional symptoms.  Pt states good ability with ADL's, has low fall risk, home safety reviewed and adequate, no other significant changes in hearing or vision, and only occasionally active with exercise. Out of BP med for 5 days, just got back in town yesterday.  Vyvanse on longer covered by insurance, asks to change back to concerta.  Is former smoker since nov 2017 after brother dx with lung cancer.  Has mild new rash to face for 2 mo without swelling or pain Past Medical History:  Diagnosis Date  . Attention deficit disorder of adult 12/12/2008   Qualifier: Diagnosis of  By: Jenny Reichmann MD, Hunt Oris   . Hyperlipidemia   . Hypertension   . Seizure disorder ([place])    None since 2001   Past Surgical History:  Procedure Laterality Date  . CHOLECYSTECTOMY      reports that he has been smoking.  He has been smoking about 0.50 packs per day. He has never used smokeless tobacco. He reports that he drinks about 7.2 oz of alcohol per week . He reports that he does not use drugs. family history includes Alcohol abuse in his father; Hypertension in his brother and father. No Known Allergies Current Outpatient Prescriptions on File Prior to Visit  Medication Sig Dispense Refill  . cetirizine (ZYRTEC) 10 MG tablet Take 1 tablet (10 mg total) by  mouth daily. 30 tablet 11  . lisdexamfetamine (VYVANSE) 30 MG capsule Take 1 capsule (30 mg total) by mouth daily. 30 capsule 0  . triamcinolone (NASACORT AQ) 55 MCG/ACT AERO nasal inhaler Place 2 sprays into the nose daily. 1 Inhaler 12   No current facility-administered medications on file prior to visit.    Review of Systems Constitutional: Negative for other unusual diaphoresis, sweats, appetite or weight changes HENT: Negative for other worsening hearing loss, ear pain, facial swelling, mouth sores or neck stiffness.   Eyes: Negative for other worsening pain, redness or other visual disturbance.  Respiratory: Negative for other stridor or swelling Cardiovascular: Negative for other palpitations or other chest pain  Gastrointestinal: Negative for worsening diarrhea or loose stools, blood in stool, distention or other pain Genitourinary: Negative for hematuria, flank pain or other change in urine volume.  Musculoskeletal: Negative for myalgias or other joint swelling.  Skin: Negative for other color change, or other wound or worsening drainage.  Neurological: Negative for other syncope or numbness. Hematological: Negative for other adenopathy or swelling Psychiatric/Behavioral: Negative for hallucinations, other worsening agitation, SI, self-injury, or new decreased concentration All other system neg per pt    Objective:   Physical Exam BP (!) 142/100   Pulse 88   Temp 98.4 F (36.9 C) (Oral)   Ht 6\' 1"  (1.854 m)   Wt 223 lb (101.2 kg)   SpO2 99%   BMI 29.42 kg/m  VS noted,  Constitutional: Pt is oriented to person, place, and time. Appears well-developed and well-nourished, in no significant distress and comfortable Head: Normocephalic and atraumatic  Eyes: Conjunctivae and EOM are normal. Pupils are equal, round, and reactive to light Right Ear: External ear normal without discharge Left Ear: External ear normal without discharge Nose: Nose without discharge or  deformity Mouth/Throat: Oropharynx is without other ulcerations and moist  Neck: Normal range of motion. Neck supple. No JVD present. No tracheal deviation present or significant neck LA or mass Cardiovascular: Normal rate, regular rhythm, normal heart sounds and intact distal pulses.   Pulmonary/Chest: WOB normal and breath sounds without rales or wheezing  Abdominal: Soft. Bowel sounds are normal. NT. No HSM  Musculoskeletal: Normal range of motion. Exhibits no edema Lymphadenopathy: Has no other cervical adenopathy.  Neurological: Pt is alert and oriented to person, place, and time. Pt has normal reflexes. No cranial nerve deficit. Motor grossly intact, Gait intact Skin: Skin is warm and dry. + mild nasal/oaranasal nontender erythema rash noted or new ulcerations Psychiatric:  Has normal mood and affect. Behavior is normal without agitation No other exam findings    Assessment & Plan:

## 2017-01-01 ENCOUNTER — Telehealth: Payer: Self-pay

## 2017-01-01 NOTE — Telephone Encounter (Signed)
Called pt, left a detailed msg regarding labs results.

## 2017-01-01 NOTE — Telephone Encounter (Signed)
-----   Message from Biagio Borg, MD sent at 12/31/2016  5:35 PM EDT ----- Letter sent, cont same tx except  The test results show that your current treatment is OK, except the LDL cholesterol is still mildly elevated.  Since the goal is to be less than 100, we should increase the crestor to 40 mg per day.  A new prescription will be sent, and you should be notified from the office as well.    Shawntay Prest to please inform pt, I will do rx

## 2017-01-05 NOTE — Assessment & Plan Note (Signed)

## 2017-01-05 NOTE — Assessment & Plan Note (Signed)
Lab Results  Component Value Date   LDLCALC 124 (H) 12/31/2016  declines statin for now, cont diet

## 2017-01-05 NOTE — Assessment & Plan Note (Signed)
Urged to cont to stay quit

## 2017-01-05 NOTE — Assessment & Plan Note (Signed)
Uncontrolled, to restart oral meds

## 2017-01-05 NOTE — Assessment & Plan Note (Signed)
[place] for change to concerta,  to f/u any worsening symptoms or concerns

## 2017-01-05 NOTE — Assessment & Plan Note (Signed)
?   Rosacea, for topical flagyl asd

## 2017-02-05 DIAGNOSIS — F419 Anxiety disorder, unspecified: Secondary | ICD-10-CM | POA: Diagnosis not present

## 2017-02-05 DIAGNOSIS — F900 Attention-deficit hyperactivity disorder, predominantly inattentive type: Secondary | ICD-10-CM | POA: Diagnosis not present

## 2017-03-26 DIAGNOSIS — F419 Anxiety disorder, unspecified: Secondary | ICD-10-CM | POA: Diagnosis not present

## 2017-03-26 DIAGNOSIS — F900 Attention-deficit hyperactivity disorder, predominantly inattentive type: Secondary | ICD-10-CM | POA: Diagnosis not present

## 2017-04-13 ENCOUNTER — Telehealth: Payer: Self-pay | Admitting: Internal Medicine

## 2017-04-13 NOTE — Telephone Encounter (Signed)
Patient is requesting refill on concerta [facility]. Call once ready for pick up.  States that Dr. Jenny Reichmann will write for three months.

## 2017-04-13 NOTE — Telephone Encounter (Signed)
MD out of office today will forward to his desktop for his authorization on tomorrow...[name]

## 2017-04-14 MED ORDER — METHYLPHENIDATE HCL ER (OSM) 36 MG PO TBCR: 36.0000 mg | EXTENDED_RELEASE_TABLET | Freq: Every day | ORAL | 0 refills | Status: DC

## 2017-04-14 NOTE — Telephone Encounter (Signed)
Done hardcopy to [place]  

## 2017-04-14 NOTE — Telephone Encounter (Signed)
Informed pt Scripts at front desk

## 2017-04-30 ENCOUNTER — Telehealth: Payer: Self-pay | Admitting: Internal Medicine

## 2017-04-30 MED ORDER — KETOCONAZOLE 200 MG PO TABS: 200.0000 mg | ORAL_TABLET | Freq: Every day | ORAL | 0 refills | Status: DC

## 2017-04-30 NOTE — Telephone Encounter (Signed)
Notified pt MD ok'd [facility]. ...Kenneth Atkins

## 2017-04-30 NOTE — Telephone Encounter (Signed)
PT called stating that he would like a prescription for ketoconazole (NIZORAL) 200 MG tablet [facility] [address]. He said that he has been given this before. 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 He has something like athletes foot on both feet and thighs. He has tried powder but it is not helping. Can this be sent over for him? Please advise.

## 2017-04-30 NOTE — Telephone Encounter (Signed)
Ok, done erx 

## 2017-04-30 NOTE — Telephone Encounter (Signed)
Pls advise on msg below.../lmb 

## 2017-07-21 ENCOUNTER — Telehealth: Payer: Self-pay | Admitting: Internal Medicine

## 2017-07-21 MED ORDER — METHYLPHENIDATE HCL ER (OSM) 36 MG PO TBCR: 36.0000 mg | EXTENDED_RELEASE_TABLET | Freq: Every day | ORAL | 0 refills | Status: DC

## 2017-07-21 NOTE — Telephone Encounter (Signed)
MD is out of the office this week. Check [place] registry last filled 06/22/2017 pls advise...[name]

## 2017-07-21 NOTE — Telephone Encounter (Signed)
Patient requesting refill on concerta.  [facility] [address].

## 2017-07-21 NOTE — Telephone Encounter (Signed)
OK to fill this/these prescription(s) with additional refills x0 Needs to have an OV every 3 months Thank you!  

## 2017-07-21 NOTE — Telephone Encounter (Signed)
Notified pt rx ready for pick-up.../lmb 

## 2017-08-24 ENCOUNTER — Telehealth: Payer: Self-pay | Admitting: Internal Medicine

## 2017-08-24 MED ORDER — METHYLPHENIDATE HCL ER (OSM) 36 MG PO TBCR: 36.0000 mg | EXTENDED_RELEASE_TABLET | Freq: Every day | ORAL | 0 refills | Status: DC

## 2017-08-24 NOTE — Telephone Encounter (Signed)
Done erx 

## 2017-08-24 NOTE — Telephone Encounter (Signed)
Check [place] registry last filled 07/21/2017.../lmb  

## 2017-08-24 NOTE — Telephone Encounter (Signed)
Pt called requesting a refill on methylphenidate (CONCERTA) 36 MG PO CR [facility] [address].

## 2017-08-24 NOTE — Telephone Encounter (Signed)
Notified [facility].../lmb

## 2017-09-18 ENCOUNTER — Encounter: Payer: Self-pay | Admitting: Family

## 2017-09-18 ENCOUNTER — Ambulatory Visit: Payer: BLUE CROSS/BLUE SHIELD | Admitting: Family

## 2017-09-18 VITALS — BP 120/84 | HR 58 | Temp 98.4°F | Ht 73.0 in | Wt 234.0 lb

## 2017-09-18 DIAGNOSIS — M25532 Pain in left wrist: Secondary | ICD-10-CM

## 2017-09-18 DIAGNOSIS — G8929 Other chronic pain: Secondary | ICD-10-CM

## 2017-09-18 MED ORDER — MELOXICAM 15 MG PO TABS: 15.0000 mg | ORAL_TABLET | Freq: Every day | ORAL | 0 refills | Status: DC

## 2017-09-18 NOTE — Progress Notes (Signed)
Kenneth Atkins is a 51 y.o. male with the following history as recorded in EpicCare:  Patient Active Problem List   Diagnosis Date Noted  . Rash of face 12/31/2016  . Former smoker 12/31/2016  . Snoring 10/05/2015  . Allergic rhinitis 10/05/2015  . Chalazion of left upper eyelid 10/03/2011  . Preventative health care 10/03/2011  . Hyperlipidemia 10/23/2010  . Attention deficit disorder of adult 12/12/2008  . Essential hypertension 12/12/2008  . SEIZURE DISORDER 06/27/2007    Current Outpatient Medications  Medication Sig Dispense Refill  . ketoconazole (NIZORAL) 200 MG tablet Take 1 tablet (200 mg total) by mouth daily. 7 tablet 0  . lisinopril (PRINIVIL,ZESTRIL) 20 MG tablet Take 1 tablet (20 mg total) by mouth daily. 90 tablet 3  . methylphenidate (CONCERTA) 36 MG PO CR tablet Take 1 tablet (36 mg total) by mouth daily. 30 tablet 0  . rosuvastatin (CRESTOR) 40 MG tablet Take 1 tablet (40 mg total) by mouth daily. 90 tablet 3  . triamcinolone (NASACORT AQ) 55 MCG/ACT AERO nasal inhaler Place 2 sprays into the nose daily. 1 Inhaler 12  . meloxicam (MOBIC) 15 MG tablet Take 1 tablet (15 mg total) by mouth daily. 30 tablet 0   No current facility-administered medications for this visit.     Allergies: Patient has no known allergies.  Past Medical History:  Diagnosis Date  . Attention deficit disorder of adult 12/12/2008   Qualifier: Diagnosis of  By: Jenny Reichmann MD, Hunt Oris   . Hyperlipidemia   . Hypertension   . Seizure disorder ([place])    None since 2001    Past Surgical History:  Procedure Laterality Date  . CHOLECYSTECTOMY      Family History  Problem Relation Age of Onset  . Hypertension Father   . Alcohol abuse Father   . Hypertension Brother     Social History   Tobacco Use  . Smoking status: Current Every Day Smoker    Packs/day: 0.50  . Smokeless tobacco: Never Used  Substance Use Topics  . 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 Denies any numbness/tingling- describes as a "sharp pain" in the wrist radiating up into the elbow;  Objective:  Vitals:   09/18/17 0845  BP: 120/84  Pulse: (!) 58  Temp: 98.4 F (36.9 C)  TempSrc: Oral  SpO2: 97%  Weight: 234 lb (106.1 kg)  Height: 6\' 1"  (1.854 m)    General: Well developed, well nourished, in no acute distress  Skin : Warm and dry.  Head: Normocephalic and atraumatic  Lungs: Respirations unlabored; clear to auscultation bilaterally without wheeze, rales, rhonchi  Musculoskeletal: No deformities; swelling noted in left hand/ medial left wrist  Extremities: No edema, cyanosis, clubbing  Vessels: Symmetric bilaterally  Neurologic: Alert and oriented; speech intact; face symmetrical; moves all extremities well; CNII-XII intact without focal deficit  Assessment:  1. Chronic pain of left wrist     Plan:  Trial of Mobic 15 mg daily; encouraged to ice and rest as much as possible; use OTC wrist splint at night; refer to hand specialist to discuss treatment options. Follow-up as needed otherwise.   No Follow-up on file.  Orders Placed This Encounter  Procedures  . Ambulatory referral to Orthopedic Surgery  Referral Priority:   Routine    Referral Type:   Surgical    Referral Reason:   Specialty Services Required    Requested Specialty:   Orthopedic Surgery    Number of Visits Requested:   1    Requested Prescriptions   Signed Prescriptions Disp Refills  . meloxicam (MOBIC) 15 MG tablet 30 tablet 0    Sig: Take 1 tablet (15 mg total) by mouth daily.

## 2017-09-21 ENCOUNTER — Other Ambulatory Visit: Payer: Self-pay | Admitting: Internal Medicine

## 2017-09-23 MED ORDER — METHYLPHENIDATE HCL ER (OSM) 36 MG PO TBCR: 36.0000 mg | EXTENDED_RELEASE_TABLET | Freq: Every day | ORAL | 0 refills | Status: DC

## 2017-09-23 NOTE — Telephone Encounter (Signed)
Done erx 

## 2017-10-14 ENCOUNTER — Ambulatory Visit (INDEPENDENT_AMBULATORY_CARE_PROVIDER_SITE_OTHER): Payer: BLUE CROSS/BLUE SHIELD | Admitting: Physician Assistant

## 2017-10-14 ENCOUNTER — Encounter (INDEPENDENT_AMBULATORY_CARE_PROVIDER_SITE_OTHER): Payer: Self-pay | Admitting: Physician Assistant

## 2017-10-14 ENCOUNTER — Ambulatory Visit (INDEPENDENT_AMBULATORY_CARE_PROVIDER_SITE_OTHER): Payer: BLUE CROSS/BLUE SHIELD

## 2017-10-14 DIAGNOSIS — M25532 Pain in left wrist: Secondary | ICD-10-CM | POA: Diagnosis not present

## 2017-10-14 DIAGNOSIS — M79642 Pain in left hand: Secondary | ICD-10-CM

## 2017-10-14 NOTE — Progress Notes (Signed)
Office Visit Note   Patient: Kenneth Atkins           Date of Birth: Oct 14, 1965           MRN: [id number] Visit Date: 10/14/2017              Requested by: Marrian Salvage, [address], [place] [address] PCP: Biagio Borg, MD   Assessment & Plan: Visit Diagnoses:  1. Pain in left wrist   2. Pain in left hand     Plan: We will obtain an MRI of his left wrist and hand to evaluate cystic changes in the carpal bones and the distal radius.  Have him follow-up after the MRI with Dr. Ninfa Linden to go over the results and discuss further treatment.  He will continue his meloxicam in the interim.  Activities as tolerated.  Follow-Up Instructions: Return for after MRI.   Orders:  Orders Placed This Encounter  Procedures  . XR Wrist Complete Left  . MR Wrist Left w/o contrast  . MR HAND LEFT WO CONTRAST   No orders of the defined types were placed in this encounter.     Procedures: No procedures performed   Clinical Data: No additional findings.   Subjective: Chief Complaint  Patient presents with  . Left Wrist - Pain    HPI Kenneth Atkins is a 51 year patient we are seeing for the first time .  Comes in today due to chronic left wrist pain.  He states the pain is been ongoing for years.  No known injury to the left wrist or hand.  He is a Biomedical scientist and plays guitar.  The pain in his wrist does cause him significant pain that radiates of the arm with extensive use of the wrist and hand.  He is tried a wrist brace which did not help.  Is also tried meloxicam which helps some.  Denies any radicular symptoms down the arm.  He denies any other joint pain.  No fevers chills shortness of breath chest pain.  Former smoker smoked 20 years half pack a day. Review of Systems See HPI otherwise negative  Objective: Vital Signs:  6 foot 232 pounds  Physical Exam  Constitutional: He is oriented to person, place, and time. He appears well-developed and well-nourished.  No distress.  Cardiovascular: Intact distal pulses.  Pulmonary/Chest: Effort normal.  Neurological: He is alert and oriented to person, place, and time.  Skin: He is not diaphoretic.  Psychiatric: He has a normal mood and affect.    Ortho Exam Full motor bilateral hands.  Sensation bilateral hands.  No rashes skin lesions ulcerations erythema ecchymosis edema or abnormal warmth of either hand.  Radial pulses are intact bilaterally.  He is nontender over bilateral wrist dorsal and volar aspects.  Has good ulnar and radial deviation without pain.  Limited dorsiflexion of the left wrist compared to the right.  Full supination pronation of bilateral forearms.  No tenderness of the elbows bilaterally. Specialty Comments:  No specialty comments available.  Imaging: Xr Wrist Complete Left  Result Date: 10/14/2017 Left wrist 3 views: No acute fractures.  Carpal joints are all well maintained.  The wrist joint is well-maintained.  Subchondral cystic changes in the distal radius and multiple carpal bones with cystic changes.    PMFS History: Patient Active Problem List   Diagnosis Date Noted  . Rash of face 12/31/2016  . Former smoker 12/31/2016  . Snoring 10/05/2015  . Allergic  rhinitis 10/05/2015  . Chalazion of left upper eyelid 10/03/2011  . Preventative health care 10/03/2011  . Hyperlipidemia 10/23/2010  . Attention deficit disorder of adult 12/12/2008  . Essential hypertension 12/12/2008  . SEIZURE DISORDER 06/27/2007   Past Medical History:  Diagnosis Date  . Attention deficit disorder of adult 12/12/2008   Qualifier: Diagnosis of  By: Jenny Reichmann MD, Hunt Oris   . Hyperlipidemia   . Hypertension   . Seizure disorder ([place])    None since 2001    Family History  Problem Relation Age of Onset  . Hypertension Father   . Alcohol abuse Father   . Hypertension Brother     Past Surgical History:  Procedure Laterality Date  . CHOLECYSTECTOMY     Social History   Occupational History   . Not on file  Tobacco Use  . Smoking status: Former Smoker    Packs/day: 0.50    Last attempt to quit: 10/14/2016    Years since quitting: 1.0  . Smokeless tobacco: Never Used  . Tobacco comment: STATES HE QUIT SMOKING A YEAR AGO  Substance and Sexual Activity  . Alcohol use: Yes    Alcohol/week: 7.2 oz    Types: 12 Cans of beer per week  . Drug use: No  . Sexual activity: Not on file

## 2017-10-19 ENCOUNTER — Other Ambulatory Visit: Payer: Self-pay

## 2017-10-19 MED ORDER — MELOXICAM 15 MG PO TABS: 15.0000 mg | ORAL_TABLET | Freq: Every day | ORAL | 2 refills | Status: DC

## 2017-10-26 ENCOUNTER — Ambulatory Visit
Admission: RE | Admit: 2017-10-26 | Discharge: 2017-10-26 | Disposition: A | Discharge status: Home / Self Care | Payer: BLUE CROSS/BLUE SHIELD | Source: Ambulatory Visit | Attending: Physician Assistant | Admitting: Physician Assistant

## 2017-10-26 ENCOUNTER — Other Ambulatory Visit: Payer: Self-pay | Admitting: Internal Medicine

## 2017-10-26 DIAGNOSIS — M79642 Pain in left hand: Secondary | ICD-10-CM

## 2017-10-26 DIAGNOSIS — M189 Osteoarthritis of first carpometacarpal joint, unspecified: Secondary | ICD-10-CM | POA: Diagnosis not present

## 2017-10-26 DIAGNOSIS — M25532 Pain in left wrist: Secondary | ICD-10-CM

## 2017-10-26 DIAGNOSIS — M19032 Primary osteoarthritis, left wrist: Secondary | ICD-10-CM | POA: Diagnosis not present

## 2017-10-26 MED ORDER — METHYLPHENIDATE HCL ER (OSM) 36 MG PO TBCR: 36.0000 mg | EXTENDED_RELEASE_TABLET | Freq: Every day | ORAL | 0 refills | Status: DC

## 2017-10-26 NOTE — Telephone Encounter (Signed)
Done erx 

## 2017-10-29 ENCOUNTER — Encounter (INDEPENDENT_AMBULATORY_CARE_PROVIDER_SITE_OTHER): Payer: Self-pay | Admitting: Physician Assistant

## 2017-10-30 ENCOUNTER — Telehealth: Payer: Self-pay | Admitting: Family

## 2017-10-30 NOTE — Telephone Encounter (Signed)
Received refill request for his Mobic; does he need this or did pharmacy send?

## 2017-11-27 ENCOUNTER — Other Ambulatory Visit: Payer: Self-pay | Admitting: Internal Medicine

## 2017-11-27 MED ORDER — METHYLPHENIDATE HCL ER (OSM) 36 MG PO TBCR: 36.0000 mg | EXTENDED_RELEASE_TABLET | Freq: Every day | ORAL | 0 refills | Status: DC

## 2017-11-27 NOTE — Telephone Encounter (Signed)
Done erx 

## 2017-12-26 ENCOUNTER — Other Ambulatory Visit: Payer: Self-pay | Admitting: Internal Medicine

## 2017-12-28 MED ORDER — METHYLPHENIDATE HCL ER (OSM) 36 MG PO TBCR: 36.0000 mg | EXTENDED_RELEASE_TABLET | Freq: Every day | ORAL | 0 refills | Status: DC

## 2017-12-28 NOTE — Telephone Encounter (Signed)
Last filled 11/27/2017 30#

## 2017-12-28 NOTE — Telephone Encounter (Signed)
Done erx 

## 2018-01-11 ENCOUNTER — Other Ambulatory Visit: Payer: Self-pay | Admitting: Internal Medicine

## 2018-01-17 ENCOUNTER — Other Ambulatory Visit: Payer: Self-pay | Admitting: Internal Medicine

## 2018-01-18 ENCOUNTER — Other Ambulatory Visit: Payer: Self-pay | Admitting: Internal Medicine

## 2018-01-28 ENCOUNTER — Other Ambulatory Visit: Payer: Self-pay | Admitting: Internal Medicine

## 2018-01-28 MED ORDER — METHYLPHENIDATE HCL ER (OSM) 36 MG PO TBCR: 36.0000 mg | EXTENDED_RELEASE_TABLET | Freq: Every day | ORAL | 0 refills | Status: DC

## 2018-01-28 NOTE — Telephone Encounter (Signed)
12/28/2017 30# 

## 2018-01-28 NOTE — Telephone Encounter (Signed)
Done erx 

## 2018-02-10 ENCOUNTER — Encounter: Payer: Self-pay | Admitting: Internal Medicine

## 2018-02-10 MED ORDER — ROSUVASTATIN CALCIUM 40 MG PO TABS: 40.0000 mg | ORAL_TABLET | Freq: Every day | ORAL | 0 refills | Status: DC

## 2018-02-10 MED ORDER — LISINOPRIL 20 MG PO TABS: 20.0000 mg | ORAL_TABLET | Freq: Every day | ORAL | 0 refills | Status: DC

## 2018-02-24 ENCOUNTER — Other Ambulatory Visit: Payer: Self-pay | Admitting: Internal Medicine

## 2018-03-01 ENCOUNTER — Other Ambulatory Visit: Payer: Self-pay | Admitting: Internal Medicine

## 2018-03-01 MED ORDER — METHYLPHENIDATE HCL ER (OSM) 36 MG PO TBCR: 36.0000 mg | EXTENDED_RELEASE_TABLET | Freq: Every day | ORAL | 0 refills | Status: DC

## 2018-03-01 NOTE — Telephone Encounter (Signed)
Done erx 

## 2018-03-04 ENCOUNTER — Other Ambulatory Visit: Payer: Self-pay | Admitting: Internal Medicine

## 2018-03-10 ENCOUNTER — Encounter: Payer: Self-pay | Admitting: Internal Medicine

## 2018-03-10 ENCOUNTER — Ambulatory Visit: Payer: BLUE CROSS/BLUE SHIELD | Admitting: Internal Medicine

## 2018-03-10 VITALS — BP 124/82 | HR 60 | Temp 98.1°F | Ht 73.0 in | Wt 239.0 lb

## 2018-03-10 DIAGNOSIS — E785 Hyperlipidemia, unspecified: Secondary | ICD-10-CM | POA: Diagnosis not present

## 2018-03-10 DIAGNOSIS — Z0000 Encounter for general adult medical examination without abnormal findings: Secondary | ICD-10-CM

## 2018-03-10 DIAGNOSIS — I10 Essential (primary) hypertension: Secondary | ICD-10-CM

## 2018-03-10 MED ORDER — LISINOPRIL 20 MG PO TABS: 20.0000 mg | ORAL_TABLET | Freq: Every day | ORAL | 3 refills | Status: DC

## 2018-03-10 MED ORDER — ROSUVASTATIN CALCIUM 40 MG PO TABS: 40.0000 mg | ORAL_TABLET | Freq: Every day | ORAL | 2 refills | Status: DC

## 2018-03-10 MED ORDER — LISINOPRIL 20 MG PO TABS: 20.0000 mg | ORAL_TABLET | Freq: Every day | ORAL | 2 refills | Status: DC

## 2018-03-10 MED ORDER — ROSUVASTATIN CALCIUM 40 MG PO TABS: 40.0000 mg | ORAL_TABLET | Freq: Every day | ORAL | 3 refills | Status: DC

## 2018-03-10 NOTE — Assessment & Plan Note (Signed)

## 2018-03-10 NOTE — Progress Notes (Signed)
Subjective:    Patient ID: Kenneth Atkins, male    DOB: 06-Oct-1965, 52 y.o.   MRN: [id number]  HPI  Here for wellness and f/u;  Overall doing ok;  Pt denies Chest pain, worsening SOB, DOE, wheezing, orthopnea, PND, worsening LE edema, palpitations, dizziness or syncope.  Pt denies neurological change such as new headache, facial or extremity weakness.  Pt denies polydipsia, polyuria, or low sugar symptoms. Pt states overall good compliance with treatment and medications, good tolerability, and has been trying to follow appropriate diet.  Pt denies worsening depressive symptoms, suicidal ideation or panic. No fever, night sweats, wt loss, loss of appetite, or other constitutional symptoms.  Pt states good ability with ADL's, has low fall risk, home safety reviewed and adequate, no other significant changes in hearing or vision, and only occasionally active with exercise. Does also have several wks ongoing nasal allergy symptoms with clearish congestion, itch and sneezing, without fever, pain, ST, cough, swelling or wheezing, better with OTC meds. No other interval hx or new complaint Past Medical History:  Diagnosis Date  . Attention deficit disorder of adult 12/12/2008   Qualifier: Diagnosis of  By: Jenny Reichmann MD, Hunt Oris   . Hyperlipidemia   . Hypertension   . Seizure disorder ([place])    None since 2001   Past Surgical History:  Procedure Laterality Date  . CHOLECYSTECTOMY      reports that he quit smoking about 16 months ago. He smoked 0.50 packs per day. He has never used smokeless tobacco. He reports that he drinks about 7.2 oz of alcohol per week. He reports that he does not use drugs. family history includes Alcohol abuse in his father; Hypertension in his brother and father. No Known Allergies Current Outpatient Medications on File Prior to Visit  Medication Sig Dispense Refill  . meloxicam (MOBIC) 15 MG tablet Take 1 tablet (15 mg total) by mouth daily. **PT NEEDS APPOINTMENT FOR  ADDITIONAL REFILLS** 30 tablet 0  . methylphenidate (CONCERTA) 36 MG PO CR tablet Take 1 tablet (36 mg total) by mouth daily. 30 tablet 0  . triamcinolone (NASACORT AQ) 55 MCG/ACT AERO nasal inhaler Place 2 sprays into the nose daily. 1 Inhaler 12   No current facility-administered medications on file prior to visit.    Review of Systems Constitutional: Negative for other unusual diaphoresis, sweats, appetite or weight changes HENT: Negative for other worsening hearing loss, ear pain, facial swelling, mouth sores or neck stiffness.   Eyes: Negative for other worsening pain, redness or other visual disturbance.  Respiratory: Negative for other stridor or swelling Cardiovascular: Negative for other palpitations or other chest pain  Gastrointestinal: Negative for worsening diarrhea or loose stools, blood in stool, distention or other pain Genitourinary: Negative for hematuria, flank pain or other change in urine volume.  Musculoskeletal: Negative for myalgias or other joint swelling.  Skin: Negative for other color change, or other wound or worsening drainage.  Neurological: Negative for other syncope or numbness. Hematological: Negative for other adenopathy or swelling Psychiatric/Behavioral: Negative for hallucinations, other worsening agitation, SI, self-injury, or new decreased concentration All other system neg per pt    Objective:   Physical Exam BP 124/82   Pulse 60   Temp 98.1 F (36.7 C) (Oral)   Ht 6\' 1"  (1.854 m)   Wt 239 lb (108.4 kg)   SpO2 96%   BMI 31.53 kg/m  VS noted,  Constitutional: Pt is oriented to person, place, and time. Appears well-developed and well-nourished,  in no significant distress and comfortable Head: Normocephalic and atraumatic  Eyes: Conjunctivae and EOM are normal. Pupils are equal, round, and reactive to light Right Ear: External ear normal without discharge Left Ear: External ear normal without discharge Nose: Nose without discharge or  deformity Mouth/Throat: Oropharynx is without other ulcerations and moist  Neck: Normal range of motion. Neck supple. No JVD present. No tracheal deviation present or significant neck LA or mass Cardiovascular: Normal rate, regular rhythm, normal heart sounds and intact distal pulses.   Pulmonary/Chest: WOB normal and breath sounds without rales or wheezing  Abdominal: Soft. Bowel sounds are normal. NT. No HSM  Musculoskeletal: Normal range of motion. Exhibits no edema Lymphadenopathy: Has no other cervical adenopathy.  Neurological: Pt is alert and oriented to person, place, and time. Pt has normal reflexes. No cranial nerve deficit. Motor grossly intact, Gait intact Skin: Skin is warm and dry. No rash noted or new ulcerations Psychiatric:  Has normal mood and affect. Behavior is normal without agitation No other exam findings Lab Results  Component Value Date   WBC 8.3 12/31/2016   HGB 15.9 12/31/2016   HCT 46.5 12/31/2016   PLT 257.0 12/31/2016   GLUCOSE 97 12/31/2016   CHOL 205 (H) 12/31/2016   TRIG 196.0 (H) 12/31/2016   HDL 41.80 12/31/2016   LDLDIRECT 193.0 10/05/2015   LDLCALC 124 (H) 12/31/2016   ALT 47 12/31/2016   AST 25 12/31/2016   NA 141 12/31/2016   K 4.9 12/31/2016   CL 105 12/31/2016   CREATININE 0.80 12/31/2016   BUN 12 12/31/2016   CO2 29 12/31/2016   TSH 1.49 12/31/2016   PSA 0.29 12/31/2016       Assessment & Plan:

## 2018-03-10 NOTE — Patient Instructions (Addendum)
You will be contacted regarding the referral for: colonoscopy for after august 2019  Please continue all other medications as before, and refills have been done if requested.  Please have the pharmacy call with any other refills you may need.  Please continue your efforts at being more active, low cholesterol diet, and weight control.  You are otherwise up to date with prevention measures today.  Please keep your appointments with your specialists as you may have planned  Please go to the LAB in the Basement (turn left off the elevator) for the tests to be done today  You will be contacted by phone if any changes need to be made immediately.  Otherwise, you will receive a letter about your results with an explanation, but please check with MyChart first.  Please remember to sign up for MyChart if you have not done so, as this will be important to you in the future with finding out test results, communicating by private email, and scheduling acute appointments online when needed.  Please return in 1 year for your yearly visit, or sooner if needed, with Lab testing done 3-5 days before

## 2018-03-10 NOTE — Assessment & Plan Note (Signed)
stable overall by history and exam, recent data reviewed with pt, and pt to continue medical treatment as before,  to f/u any worsening symptoms or concerns BP Readings from Last 3 Encounters:  03/10/18 124/82  09/18/17 120/84  12/31/16 (!) 142/100

## 2018-03-10 NOTE — Assessment & Plan Note (Signed)
Likely improved on high dose crestor,, for f/u lab

## 2018-03-16 ENCOUNTER — Other Ambulatory Visit (INDEPENDENT_AMBULATORY_CARE_PROVIDER_SITE_OTHER): Payer: BLUE CROSS/BLUE SHIELD

## 2018-03-16 ENCOUNTER — Encounter: Payer: Self-pay | Admitting: Internal Medicine

## 2018-03-16 DIAGNOSIS — Z0000 Encounter for general adult medical examination without abnormal findings: Secondary | ICD-10-CM

## 2018-03-16 DIAGNOSIS — E785 Hyperlipidemia, unspecified: Secondary | ICD-10-CM | POA: Diagnosis not present

## 2018-03-16 LAB — LIPID PANEL
Cholesterol: 174 mg/dL (ref 0–200)
HDL: 43.1 mg/dL (ref >39.00)
NonHDL: 130.7
TRIGLYCERIDES: 251 mg/dL — AB (ref 0.0–149.0)
Total CHOL/HDL Ratio: 4
VLDL: 50.2 mg/dL — ABNORMAL HIGH (ref 0.0–40.0)

## 2018-03-16 LAB — URINALYSIS, ROUTINE W REFLEX MICROSCOPIC
Bilirubin Urine: NEGATIVE
Hgb urine dipstick: NEGATIVE
KETONES UR: NEGATIVE
Leukocytes, UA: NEGATIVE
NITRITE: NEGATIVE
PH: 5.5 (ref 5.0–8.0)
RBC / HPF: NONE SEEN (ref 0–?)
SPECIFIC GRAVITY, URINE: 1.025 (ref 1.000–1.030)
TOTAL PROTEIN, URINE-UPE24: NEGATIVE
URINE GLUCOSE: NEGATIVE
Urobilinogen, UA: 0.2 (ref 0.0–1.0)
WBC, UA: NONE SEEN (ref 0–?)

## 2018-03-16 LAB — HEPATIC FUNCTION PANEL
ALBUMIN: 4.6 g/dL (ref 3.5–5.2)
ALT: 91 U/L — AB (ref 0–53)
AST: 47 U/L — AB (ref 0–37)
Alkaline Phosphatase: 59 U/L (ref 39–117)
Bilirubin, Direct: 0.1 mg/dL (ref 0.0–0.3)
Total Bilirubin: 0.4 mg/dL (ref 0.2–1.2)
Total Protein: 7.1 g/dL (ref 6.0–8.3)

## 2018-03-16 LAB — CBC WITH DIFFERENTIAL/PLATELET
BASOS PCT: 1.4 % (ref 0.0–3.0)
Basophils Absolute: 0.1 10*3/uL (ref 0.0–0.1)
EOS PCT: 3.3 % (ref 0.0–5.0)
Eosinophils Absolute: 0.2 10*3/uL (ref 0.0–0.7)
HCT: 44.8 % (ref 39.0–52.0)
Hemoglobin: 15.7 g/dL (ref 13.0–17.0)
LYMPHS ABS: 2.3 10*3/uL (ref 0.7–4.0)
Lymphocytes Relative: 31.4 % (ref 12.0–46.0)
MCHC: 35.1 g/dL (ref 30.0–36.0)
MCV: 87.8 fl (ref 78.0–100.0)
MONOS PCT: 7.3 % (ref 3.0–12.0)
Monocytes Absolute: 0.5 10*3/uL (ref 0.1–1.0)
NEUTROS PCT: 56.6 % (ref 43.0–77.0)
Neutro Abs: 4.1 10*3/uL (ref 1.4–7.7)
Platelets: 235 10*3/uL (ref 150.0–400.0)
RBC: 5.1 Mil/uL (ref 4.22–5.81)
RDW: 13.1 % (ref 11.5–15.5)
WBC: 7.2 10*3/uL (ref 4.0–10.5)

## 2018-03-16 LAB — BASIC METABOLIC PANEL
BUN: 15 mg/dL (ref 6–23)
CALCIUM: 9.8 mg/dL (ref 8.4–10.5)
CO2: 28 mEq/L (ref 19–32)
Chloride: 105 mEq/L (ref 96–112)
Creatinine, Ser: 0.85 mg/dL (ref 0.40–1.50)
GFR: 101.49 mL/min (ref >60.00)
GLUCOSE: 119 mg/dL — AB (ref 70–99)
Potassium: 4.7 mEq/L (ref 3.5–5.1)
Sodium: 141 mEq/L (ref 135–145)

## 2018-03-16 LAB — LDL CHOLESTEROL, DIRECT: LDL DIRECT: 107 mg/dL

## 2018-03-16 LAB — TSH: TSH: 0.9 u[IU]/mL (ref 0.35–4.50)

## 2018-03-16 LAB — PSA: PSA: 0.17 ng/mL (ref 0.10–4.00)

## 2018-03-31 ENCOUNTER — Other Ambulatory Visit: Payer: Self-pay | Admitting: Internal Medicine

## 2018-03-31 MED ORDER — METHYLPHENIDATE HCL ER (OSM) 36 MG PO TBCR
36.0000 mg | EXTENDED_RELEASE_TABLET | Freq: Every day | ORAL | 0 refills | Status: DC
Start: 2018-03-31 — End: 2018-04-30

## 2018-03-31 NOTE — Telephone Encounter (Signed)
Done erx 

## 2018-04-30 ENCOUNTER — Other Ambulatory Visit: Payer: Self-pay | Admitting: Internal Medicine

## 2018-04-30 MED ORDER — METHYLPHENIDATE HCL ER (OSM) 36 MG PO TBCR: 36.0000 mg | EXTENDED_RELEASE_TABLET | Freq: Every day | ORAL | 0 refills | Status: DC

## 2018-04-30 NOTE — Telephone Encounter (Signed)
Done erx 

## 2018-06-03 ENCOUNTER — Other Ambulatory Visit: Payer: Self-pay | Admitting: Internal Medicine

## 2018-06-04 MED ORDER — METHYLPHENIDATE HCL ER (OSM) 36 MG PO TBCR: 36.0000 mg | EXTENDED_RELEASE_TABLET | Freq: Every day | ORAL | 0 refills | Status: DC

## 2018-06-04 NOTE — Telephone Encounter (Signed)
Done erx 

## 2018-06-24 ENCOUNTER — Encounter: Payer: Self-pay | Admitting: Internal Medicine

## 2018-07-01 ENCOUNTER — Other Ambulatory Visit: Payer: Self-pay | Admitting: Internal Medicine

## 2018-07-02 ENCOUNTER — Encounter: Payer: Self-pay | Admitting: Gastroenterology

## 2018-07-03 MED ORDER — METHYLPHENIDATE HCL ER (OSM) 36 MG PO TBCR: 36.0000 mg | EXTENDED_RELEASE_TABLET | Freq: Every day | ORAL | 0 refills | Status: DC

## 2018-07-03 NOTE — Telephone Encounter (Signed)
Done erx 

## 2018-08-03 ENCOUNTER — Ambulatory Visit (AMBULATORY_SURGERY_CENTER): Payer: Self-pay | Admitting: *Deleted

## 2018-08-03 ENCOUNTER — Encounter: Payer: Self-pay | Admitting: Gastroenterology

## 2018-08-03 VITALS — Ht 73.0 in | Wt 236.0 lb

## 2018-08-03 DIAGNOSIS — Z1211 Encounter for screening for malignant neoplasm of colon: Secondary | ICD-10-CM

## 2018-08-03 MED ORDER — NA SULFATE-K SULFATE-MG SULF 17.5-3.13-1.6 GM/177ML PO SOLN: 1.0000 | Freq: Once | ORAL | 0 refills | Status: AC

## 2018-08-03 NOTE — Progress Notes (Signed)
No egg or soy allergy known to patient  No issues with past sedation with any surgeries  or procedures, no intubation problems  No diet pills per patient No home 02 use per patient  No blood thinners per patient  Pt denies issues with constipation  No A fib or A flutter  EMMI video sent to pt's e mail  Suprep $15 coupon to pt   

## 2018-08-17 ENCOUNTER — Encounter: Payer: Self-pay | Admitting: Gastroenterology

## 2018-08-17 ENCOUNTER — Ambulatory Visit (AMBULATORY_SURGERY_CENTER): Payer: BLUE CROSS/BLUE SHIELD | Admitting: Gastroenterology

## 2018-08-17 VITALS — BP 135/92 | HR 53 | Temp 98.6°F | Resp 15 | Ht 73.0 in | Wt 236.0 lb

## 2018-08-17 DIAGNOSIS — D125 Benign neoplasm of sigmoid colon: Secondary | ICD-10-CM | POA: Diagnosis not present

## 2018-08-17 DIAGNOSIS — D122 Benign neoplasm of ascending colon: Secondary | ICD-10-CM

## 2018-08-17 DIAGNOSIS — Z1211 Encounter for screening for malignant neoplasm of colon: Secondary | ICD-10-CM

## 2018-08-17 DIAGNOSIS — D120 Benign neoplasm of cecum: Secondary | ICD-10-CM

## 2018-08-17 DIAGNOSIS — D123 Benign neoplasm of transverse colon: Secondary | ICD-10-CM | POA: Diagnosis not present

## 2018-08-17 DIAGNOSIS — K635 Polyp of colon: Secondary | ICD-10-CM

## 2018-08-17 MED ORDER — SODIUM CHLORIDE 0.9 % IV SOLN: 500.0000 mL | Freq: Once | INTRAVENOUS | Status: DC

## 2018-08-17 NOTE — Progress Notes (Signed)
Called to room to assist during endoscopic procedure.  Patient ID and intended procedure confirmed with present staff. Received instructions for my participation in the procedure from the performing physician.  

## 2018-08-17 NOTE — Patient Instructions (Addendum)
YOU HAD AN ENDOSCOPIC PROCEDURE TODAY AT THE [facility]:   Refer to the procedure report that was given to you for any specific questions about what was found during the examination.  If the procedure report does not answer your questions, please call your gastroenterologist to clarify.  If you requested that your care partner not be given the details of your procedure findings, then the procedure report has been included in a sealed envelope for you to review at your convenience later.  YOU SHOULD EXPECT: Some feelings of bloating in the abdomen. Passage of more gas than usual.  Walking can help get rid of the air that was put into your GI tract during the procedure and reduce the bloating. If you had a lower endoscopy (such as a colonoscopy or flexible sigmoidoscopy) you may notice spotting of blood in your stool or on the toilet paper. If you underwent a bowel prep for your procedure, you may not have a normal bowel movement for a few days.  Please Note:  You might notice some irritation and congestion in your nose or some drainage.  This is from the oxygen used during your procedure.  There is no need for concern and it should clear up in a day or so.  SYMPTOMS TO REPORT IMMEDIATELY:   Following lower endoscopy (colonoscopy or flexible sigmoidoscopy):  Excessive amounts of blood in the stool  Significant tenderness or worsening of abdominal pains  Swelling of the abdomen that is new, acute  Fever of 100F or higher   For urgent or emergent issues, a gastroenterologist can be reached at any hour by calling [phone].   DIET:  We do recommend a small meal at first, but then you may proceed to your regular diet.  Drink plenty of fluids but you should avoid alcoholic beverages for 24 hours.  ACTIVITY:  You should plan to take it easy for the rest of today and you should NOT DRIVE or use heavy machinery until tomorrow (because of the sedation medicines used during the test).     FOLLOW UP: Our staff will call the number listed on your records the next business day following your procedure to check on you and address any questions or concerns that you may have regarding the information given to you following your procedure. If we do not reach you, we will leave a message.  However, if you are feeling well and you are not experiencing any problems, there is no need to return our call.  We will assume that you have returned to your regular daily activities without incident.  If any biopsies were taken you will be contacted by phone or by letter within the next 1-3 weeks.  Please call us at [phone] if you have not heard about the biopsies in 3 weeks.    SIGNATURES/CONFIDENTIALITY: You and/or your care partner have signed paperwork which will be entered into your electronic medical record.  These signatures attest to the fact that that the information above on your After Visit Summary has been reviewed and is understood.  Full responsibility of the confidentiality of this discharge information lies with you and/or your care-partner.    Handouts were given to your care partner on polyps and hemorrhoids. You may resume your current medications today. Await biopsy results. Please call if any questions or concerns.   

## 2018-08-17 NOTE — Progress Notes (Signed)
No problems noted in the recovery room. maw 

## 2018-08-17 NOTE — Op Note (Addendum)
[facility] Patient Name: Kenneth Atkins Procedure Date: 08/17/2018 11:56 AM MRN: [id number] Endoscopist: Remo Lipps P. Havery Moros , MD Age: 52 Referring MD:  Date of Birth: May 15, 1966 Gender: Male Account #: [id number] Procedure:                Colonoscopy Indications:              Screening for colorectal malignant neoplasm, This                            is the patient's first colonoscopy Medicines:                Monitored Anesthesia Care Procedure:                Pre-Anesthesia Assessment:                           - Prior to the procedure, a History and Physical                            was performed, and patient medications and                            allergies were reviewed. The patient's tolerance of                            previous anesthesia was also reviewed. The risks                            and benefits of the procedure and the sedation                            options and risks were discussed with the patient.                            All questions were answered, and informed consent                            was obtained. Prior Anticoagulants: The patient has                            taken no previous anticoagulant or antiplatelet                            agents. ASA Grade Assessment: III - A patient with                            severe systemic disease. After reviewing the risks                            and benefits, the patient was deemed in                            satisfactory condition to undergo the procedure.  After obtaining informed consent, the colonoscope                            was passed under direct vision. Throughout the                            procedure, the patient's blood pressure, pulse, and                            oxygen saturations were monitored continuously. The                            Colonoscope was introduced through the anus and                            advanced to the  the cecum, identified by                            appendiceal orifice and ileocecal valve. The                            colonoscopy was performed without difficulty. The                            patient tolerated the procedure well. The quality                            of the bowel preparation was good. The ileocecal                            valve, appendiceal orifice, and rectum were                            photographed. Scope In: 12:01:22 PM Scope Out: 12:19:10 PM Scope Withdrawal Time: 0 hours 15 minutes 49 seconds  Total Procedure Duration: 0 hours 17 minutes 48 seconds  Findings:                 The perianal and digital rectal examinations were                            normal.                           A 3 mm polyp was found in the cecum. The polyp was                            flat. The polyp was removed with a cold snare.                            Resection and retrieval were complete.                           A diminutive polyp was found in the ascending  colon. The polyp was sessile. The polyp was removed                            with a cold snare. Resection and retrieval were                            complete.                           A 3 mm polyp was found in the splenic flexure. The                            polyp was flat. The polyp was removed with a cold                            snare. Resection and retrieval were complete.                           A 3 mm polyp was found in the sigmoid colon. The                            polyp was sessile. The polyp was removed with a                            cold snare. Resection and retrieval were complete.                           Internal hemorrhoids were found during                            retroflexion. The hemorrhoids were small.                           The exam was otherwise without abnormality. Complications:            No immediate complications. Estimated blood loss:                             Minimal. Estimated Blood Loss:     Estimated blood loss was minimal. Impression:               - One 3 mm polyp in the cecum, removed with a cold                            snare. Resected and retrieved.                           - One diminutive polyp in the ascending colon,                            removed with a cold snare. Resected and retrieved.                           - One 3 mm polyp at the  splenic flexure, removed                            with a cold snare. Resected and retrieved.                           - One 3 mm polyp in the sigmoid colon, removed with                            a cold snare. Resected and retrieved.                           - Internal hemorrhoids.                           - The examination was otherwise normal. Recommendation:           - Patient has a contact number available for                            emergencies. The signs and symptoms of potential                            delayed complications were discussed with the                            patient. Return to normal activities tomorrow.                            Written discharge instructions were provided to the                            patient.                           - Resume previous diet.                           - Await pathology results. Remo Lipps P. Armbruster, MD 08/17/2018 12:22:20 PM This report has been signed electronically.

## 2018-08-17 NOTE — Progress Notes (Signed)
Report given to PACU, vss 

## 2018-08-18 ENCOUNTER — Telehealth: Payer: Self-pay | Admitting: *Deleted

## 2018-08-18 NOTE — Telephone Encounter (Signed)
No answer, second call, left message to call if questions or concerns.

## 2018-08-18 NOTE — Telephone Encounter (Signed)
No answer. Left message to call if questions or concerns. 

## 2018-09-02 ENCOUNTER — Other Ambulatory Visit: Payer: Self-pay | Admitting: Internal Medicine

## 2018-09-03 ENCOUNTER — Other Ambulatory Visit: Payer: Self-pay | Admitting: Internal Medicine

## 2018-09-03 MED ORDER — METHYLPHENIDATE HCL ER (OSM) 36 MG PO TBCR: 36.0000 mg | EXTENDED_RELEASE_TABLET | Freq: Every day | ORAL | 0 refills | Status: DC

## 2018-09-03 NOTE — Telephone Encounter (Signed)
MD approved and sent electronically to pof../lmb  

## 2018-09-03 NOTE — Telephone Encounter (Signed)
Done erx 

## 2018-10-02 ENCOUNTER — Other Ambulatory Visit: Payer: Self-pay | Admitting: Internal Medicine

## 2018-10-04 MED ORDER — METHYLPHENIDATE HCL ER (OSM) 36 MG PO TBCR: 36.0000 mg | EXTENDED_RELEASE_TABLET | Freq: Every day | ORAL | 0 refills | Status: DC

## 2018-10-04 NOTE — Telephone Encounter (Signed)
Done erx 

## 2018-11-04 ENCOUNTER — Other Ambulatory Visit: Payer: Self-pay | Admitting: Internal Medicine

## 2018-11-04 MED ORDER — METHYLPHENIDATE HCL ER (OSM) 36 MG PO TBCR: 36.0000 mg | EXTENDED_RELEASE_TABLET | Freq: Every day | ORAL | 0 refills | Status: DC

## 2018-11-04 NOTE — Telephone Encounter (Signed)
Done erx 

## 2018-12-04 ENCOUNTER — Other Ambulatory Visit: Payer: Self-pay | Admitting: Internal Medicine

## 2018-12-06 ENCOUNTER — Other Ambulatory Visit: Payer: Self-pay | Admitting: Internal Medicine

## 2018-12-06 MED ORDER — METHYLPHENIDATE HCL ER (OSM) 36 MG PO TBCR: 36.0000 mg | EXTENDED_RELEASE_TABLET | Freq: Every day | ORAL | 0 refills | Status: DC

## 2018-12-06 NOTE — Telephone Encounter (Signed)
Erx failed - done hardcopy to [facility]

## 2018-12-06 NOTE — Telephone Encounter (Signed)
Correction, hardcopy not able to be done to [facility] and erx failed  May need to send to different pharmacy

## 2018-12-06 NOTE — Telephone Encounter (Signed)
Done erx 

## 2019-01-03 ENCOUNTER — Other Ambulatory Visit: Payer: Self-pay | Admitting: Internal Medicine

## 2019-01-03 MED ORDER — METHYLPHENIDATE HCL ER (OSM) 36 MG PO TBCR: 36.0000 mg | EXTENDED_RELEASE_TABLET | Freq: Every day | ORAL | 0 refills | Status: DC

## 2019-01-03 NOTE — Telephone Encounter (Signed)
Done erx 

## 2019-02-02 ENCOUNTER — Other Ambulatory Visit: Payer: Self-pay | Admitting: Internal Medicine

## 2019-02-02 MED ORDER — METHYLPHENIDATE HCL ER (OSM) 36 MG PO TBCR: 36.0000 mg | EXTENDED_RELEASE_TABLET | Freq: Every day | ORAL | 0 refills | Status: DC

## 2019-02-02 NOTE — Telephone Encounter (Signed)
Done erx 

## 2019-03-04 ENCOUNTER — Other Ambulatory Visit: Payer: Self-pay | Admitting: Internal Medicine

## 2019-03-07 MED ORDER — METHYLPHENIDATE HCL ER (OSM) 36 MG PO TBCR: 36.0000 mg | EXTENDED_RELEASE_TABLET | Freq: Every day | ORAL | 0 refills | Status: DC

## 2019-03-07 NOTE — Telephone Encounter (Signed)
Patient calling in stating he would like this taken care of as soon as possible.

## 2019-03-07 NOTE — Telephone Encounter (Signed)
Done erx  Please let pt know to make ROV for further refills - can be doxy if he likes

## 2019-03-07 NOTE — Telephone Encounter (Signed)
[place] Controlled Database Checked Last filled: 02/02/19 # 30 LOV w/you: 03/10/18 Next appt w/you: None

## 2019-03-08 NOTE — Telephone Encounter (Signed)
Pt informed. DOXY visit scheduled 03/15/19 @ 3:20 pm.

## 2019-03-15 ENCOUNTER — Encounter: Payer: Self-pay | Admitting: Internal Medicine

## 2019-03-15 ENCOUNTER — Ambulatory Visit (INDEPENDENT_AMBULATORY_CARE_PROVIDER_SITE_OTHER): Payer: BC Managed Care – PPO | Admitting: Internal Medicine

## 2019-03-15 DIAGNOSIS — R21 Rash and other nonspecific skin eruption: Secondary | ICD-10-CM

## 2019-03-15 DIAGNOSIS — E611 Iron deficiency: Secondary | ICD-10-CM

## 2019-03-15 DIAGNOSIS — Z0000 Encounter for general adult medical examination without abnormal findings: Secondary | ICD-10-CM | POA: Diagnosis not present

## 2019-03-15 DIAGNOSIS — E538 Deficiency of other specified B group vitamins: Secondary | ICD-10-CM | POA: Diagnosis not present

## 2019-03-15 DIAGNOSIS — E559 Vitamin D deficiency, unspecified: Secondary | ICD-10-CM

## 2019-03-15 MED ORDER — KETOCONAZOLE 2 % EX CREA: 1.0000 "application " | TOPICAL_CREAM | Freq: Every day | CUTANEOUS | 1 refills | Status: DC

## 2019-03-15 NOTE — Patient Instructions (Signed)

## 2019-03-15 NOTE — Assessment & Plan Note (Signed)
For [employer] cr prn

## 2019-03-15 NOTE — Assessment & Plan Note (Signed)

## 2019-03-15 NOTE — Progress Notes (Signed)
Patient ID: LESTAT GOLOB, male   DOB: 07-11-1966, 53 y.o.   MRN: [id number]  Virtual Visit via Video Note  I connected with Traveion Ruddock Mcneel on 03/15/19 at  3:20 PM EDT by a video enabled telemedicine application and verified that I am speaking with the correct person using two identifiers.  Location: Patient: at home Provider: at office   I discussed the limitations of evaluation and management by telemedicine and the availability of in person appointments. The patient expressed understanding and agreed to proceed.  History of Present Illness: Here for wellness and f/u;  Overall doing ok;  Pt denies Chest pain, worsening SOB, DOE, wheezing, orthopnea, PND, worsening LE edema, palpitations, dizziness or syncope.  Pt denies neurological change such as new headache, facial or extremity weakness.  Pt denies polydipsia, polyuria, or low sugar symptoms. Pt states overall good compliance with treatment and medications, good tolerability, and has been trying to follow appropriate diet.  Pt denies worsening depressive symptoms, suicidal ideation or panic. No fever, night sweats, wt loss, loss of appetite, or other constitutional symptoms.  Pt states good ability with ADL's, has low fall risk, home safety reviewed and adequate, no other significant changes in hearing or vision, and only occasionally active with exercise.  Lost 15 lbs with better diet.  No new complaints but asks for refill ketoconozole cream for recurring fungal rash Past Medical History:  Diagnosis Date  . Allergy   . Arthritis   . Attention deficit disorder of adult 12/12/2008   Qualifier: Diagnosis of  By: Jenny Reichmann MD, Hunt Oris   . GERD (gastroesophageal reflux disease)   . Hyperlipidemia   . Hypertension   . Seizure disorder ([place])    None since 2001- pt states none since 2001- off Depakote    Past Surgical History:  Procedure Laterality Date  . CHOLECYSTECTOMY      reports that he quit smoking about 2 years ago. He smoked 0.50  packs per day. He has never used smokeless tobacco. He reports current alcohol use of about 12.0 standard drinks of alcohol per week. He reports that he does not use drugs. family history includes Alcohol abuse in his father; Hypertension in his brother and father. No Known Allergies Current Outpatient Medications on File Prior to Visit  Medication Sig Dispense Refill  . lisinopril (PRINIVIL,ZESTRIL) 20 MG tablet Take 1 tablet (20 mg total) by mouth daily. 90 tablet 3  . meloxicam (MOBIC) 15 MG tablet Take 1 tablet (15 mg total) by mouth daily. **PT NEEDS APPOINTMENT FOR ADDITIONAL REFILLS** 30 tablet 0  . methylphenidate (CONCERTA) 36 MG PO CR tablet Take 1 tablet (36 mg total) by mouth daily. 30 tablet 0  . rosuvastatin (CRESTOR) 40 MG tablet Take 1 tablet (40 mg total) by mouth daily. 90 tablet 3  . triamcinolone (NASACORT AQ) 55 MCG/ACT AERO nasal inhaler Place 2 sprays into the nose daily. 1 Inhaler 12   No current facility-administered medications on file prior to visit.    Observations/Objective: Alert, NAD, appropriate mood and affect, resps normal, cn 2-12 intact, moves all 4s, no visible rash or swelling Lab Results  Component Value Date   WBC 7.2 03/16/2018   HGB 15.7 03/16/2018   HCT 44.8 03/16/2018   PLT 235.0 03/16/2018   GLUCOSE 119 (H) 03/16/2018   CHOL 174 03/16/2018   TRIG 251.0 (H) 03/16/2018   HDL 43.10 03/16/2018   LDLDIRECT 107.0 03/16/2018   LDLCALC 124 (H) 12/31/2016   ALT 91 (H) 03/16/2018  AST 47 (H) 03/16/2018   NA 141 03/16/2018   K 4.7 03/16/2018   CL 105 03/16/2018   CREATININE 0.85 03/16/2018   BUN 15 03/16/2018   CO2 28 03/16/2018   TSH 0.90 03/16/2018   PSA 0.17 03/16/2018   Assessment and Plan: See notes  Follow Up Instructions: See notes   I discussed the assessment and treatment plan with the patient. The patient was provided an opportunity to ask questions and all were answered. The patient agreed with the plan and demonstrated an  understanding of the instructions.   The patient was advised to call back or seek an in-person evaluation if the symptoms worsen or if the condition fails to improve as anticipated.   Cathlean Cower, MD

## 2019-04-07 ENCOUNTER — Other Ambulatory Visit: Payer: Self-pay | Admitting: Internal Medicine

## 2019-04-07 MED ORDER — METHYLPHENIDATE HCL ER (OSM) 36 MG PO TBCR: 36.0000 mg | EXTENDED_RELEASE_TABLET | Freq: Every day | ORAL | 0 refills | Status: DC

## 2019-04-07 NOTE — Telephone Encounter (Signed)
Done erx 

## 2019-04-19 ENCOUNTER — Other Ambulatory Visit: Payer: Self-pay | Admitting: Internal Medicine

## 2019-05-07 ENCOUNTER — Other Ambulatory Visit: Payer: Self-pay | Admitting: Internal Medicine

## 2019-05-09 MED ORDER — METHYLPHENIDATE HCL ER (OSM) 36 MG PO TBCR: 36.0000 mg | EXTENDED_RELEASE_TABLET | Freq: Every day | ORAL | 0 refills | Status: DC

## 2019-05-09 NOTE — Telephone Encounter (Signed)
Done erx 

## 2019-06-06 ENCOUNTER — Other Ambulatory Visit: Payer: Self-pay | Admitting: Internal Medicine

## 2019-06-07 MED ORDER — METHYLPHENIDATE HCL ER (OSM) 36 MG PO TBCR: 36.0000 mg | EXTENDED_RELEASE_TABLET | Freq: Every day | ORAL | 0 refills | Status: DC

## 2019-06-07 NOTE — Telephone Encounter (Signed)
Done erx 

## 2019-07-04 ENCOUNTER — Other Ambulatory Visit: Payer: Self-pay | Admitting: Internal Medicine

## 2019-07-04 MED ORDER — METHYLPHENIDATE HCL ER (OSM) 36 MG PO TBCR: 36.0000 mg | EXTENDED_RELEASE_TABLET | Freq: Every day | ORAL | 0 refills | Status: DC

## 2019-07-04 NOTE — Telephone Encounter (Signed)
Done erx 

## 2019-08-05 ENCOUNTER — Other Ambulatory Visit: Payer: Self-pay | Admitting: Internal Medicine

## 2019-08-05 MED ORDER — METHYLPHENIDATE HCL ER (OSM) 36 MG PO TBCR: 36.0000 mg | EXTENDED_RELEASE_TABLET | Freq: Every day | ORAL | 0 refills | Status: DC

## 2019-08-05 NOTE — Telephone Encounter (Signed)
Done erx 

## 2019-09-07 ENCOUNTER — Other Ambulatory Visit: Payer: Self-pay | Admitting: Internal Medicine

## 2019-09-07 MED ORDER — METHYLPHENIDATE HCL ER (OSM) 36 MG PO TBCR: 36.0000 mg | EXTENDED_RELEASE_TABLET | Freq: Every day | ORAL | 0 refills | Status: DC

## 2019-09-07 NOTE — Telephone Encounter (Signed)
Done erx 

## 2019-10-07 ENCOUNTER — Other Ambulatory Visit: Payer: Self-pay | Admitting: Internal Medicine

## 2019-10-07 MED ORDER — METHYLPHENIDATE HCL ER (OSM) 36 MG PO TBCR: 36.0000 mg | EXTENDED_RELEASE_TABLET | Freq: Every day | ORAL | 0 refills | Status: DC

## 2019-10-07 NOTE — Telephone Encounter (Signed)
Done erx 

## 2019-11-08 ENCOUNTER — Other Ambulatory Visit: Payer: Self-pay | Admitting: Internal Medicine

## 2019-11-08 MED ORDER — METHYLPHENIDATE HCL ER (OSM) 36 MG PO TBCR: 36.0000 mg | EXTENDED_RELEASE_TABLET | Freq: Every day | ORAL | 0 refills | Status: DC

## 2019-11-08 NOTE — Telephone Encounter (Signed)
Done erx 

## 2019-12-08 ENCOUNTER — Other Ambulatory Visit: Payer: Self-pay | Admitting: Internal Medicine

## 2019-12-08 MED ORDER — METHYLPHENIDATE HCL ER (OSM) 36 MG PO TBCR: 36.0000 mg | EXTENDED_RELEASE_TABLET | Freq: Every day | ORAL | 0 refills | Status: DC

## 2019-12-08 NOTE — Telephone Encounter (Signed)
11/08/2019  Methylphenidate Er 36 Mg Tab 30.00  Last OV 03/15/2019 Next OV: N/S

## 2019-12-08 NOTE — Telephone Encounter (Signed)
Done erx 

## 2020-01-09 ENCOUNTER — Other Ambulatory Visit (INDEPENDENT_AMBULATORY_CARE_PROVIDER_SITE_OTHER): Payer: BC Managed Care – PPO

## 2020-01-09 ENCOUNTER — Other Ambulatory Visit: Payer: Self-pay | Admitting: Internal Medicine

## 2020-01-09 DIAGNOSIS — Z125 Encounter for screening for malignant neoplasm of prostate: Secondary | ICD-10-CM | POA: Diagnosis not present

## 2020-01-09 DIAGNOSIS — Z0000 Encounter for general adult medical examination without abnormal findings: Secondary | ICD-10-CM | POA: Diagnosis not present

## 2020-01-09 DIAGNOSIS — E538 Deficiency of other specified B group vitamins: Secondary | ICD-10-CM

## 2020-01-09 DIAGNOSIS — E559 Vitamin D deficiency, unspecified: Secondary | ICD-10-CM | POA: Diagnosis not present

## 2020-01-09 DIAGNOSIS — E611 Iron deficiency: Secondary | ICD-10-CM | POA: Diagnosis not present

## 2020-01-09 LAB — CBC WITH DIFFERENTIAL/PLATELET
Basophils Absolute: 0 10*3/uL (ref 0.0–0.1)
Basophils Relative: 0.5 % (ref 0.0–3.0)
Eosinophils Absolute: 0.2 10*3/uL (ref 0.0–0.7)
Eosinophils Relative: 3.2 % (ref 0.0–5.0)
HCT: 44.3 % (ref 39.0–52.0)
Hemoglobin: 15.6 g/dL (ref 13.0–17.0)
Lymphocytes Relative: 30.4 % (ref 12.0–46.0)
Lymphs Abs: 2.2 10*3/uL (ref 0.7–4.0)
MCHC: 35.1 g/dL (ref 30.0–36.0)
MCV: 88.1 fl (ref 78.0–100.0)
Monocytes Absolute: 0.5 10*3/uL (ref 0.1–1.0)
Monocytes Relative: 6.6 % (ref 3.0–12.0)
Neutro Abs: 4.3 10*3/uL (ref 1.4–7.7)
Neutrophils Relative %: 59.3 % (ref 43.0–77.0)
Platelets: 221 10*3/uL (ref 150.0–400.0)
RBC: 5.02 Mil/uL (ref 4.22–5.81)
RDW: 13 % (ref 11.5–15.5)
WBC: 7.3 10*3/uL (ref 4.0–10.5)

## 2020-01-09 LAB — VITAMIN D 25 HYDROXY (VIT D DEFICIENCY, FRACTURES): VITD: 12.5 ng/mL — ABNORMAL LOW (ref 30.00–100.00)

## 2020-01-09 LAB — HEPATIC FUNCTION PANEL
ALT: 67 U/L — ABNORMAL HIGH (ref 0–53)
AST: 33 U/L (ref 0–37)
Albumin: 4.6 g/dL (ref 3.5–5.2)
Alkaline Phosphatase: 63 U/L (ref 39–117)
Bilirubin, Direct: 0.2 mg/dL (ref 0.0–0.3)
Total Bilirubin: 0.7 mg/dL (ref 0.2–1.2)
Total Protein: 7 g/dL (ref 6.0–8.3)

## 2020-01-09 LAB — TSH: TSH: 0.98 u[IU]/mL (ref 0.35–4.50)

## 2020-01-09 LAB — URINALYSIS, ROUTINE W REFLEX MICROSCOPIC
Bilirubin Urine: NEGATIVE
Hgb urine dipstick: NEGATIVE
Ketones, ur: NEGATIVE
Leukocytes,Ua: NEGATIVE
Nitrite: NEGATIVE
RBC / HPF: NONE SEEN (ref 0–?)
Specific Gravity, Urine: >=1.03 — AB (ref 1.000–1.030)
Total Protein, Urine: NEGATIVE
Urine Glucose: NEGATIVE
Urobilinogen, UA: 0.2 (ref 0.0–1.0)
WBC, UA: NONE SEEN (ref 0–?)
pH: 5.5 (ref 5.0–8.0)

## 2020-01-09 LAB — BASIC METABOLIC PANEL
BUN: 12 mg/dL (ref 6–23)
CO2: 25 mEq/L (ref 19–32)
Calcium: 9.5 mg/dL (ref 8.4–10.5)
Chloride: 102 mEq/L (ref 96–112)
Creatinine, Ser: 0.82 mg/dL (ref 0.40–1.50)
GFR: 98.81 mL/min (ref >60.00)
Glucose, Bld: 131 mg/dL — ABNORMAL HIGH (ref 70–99)
Potassium: 3.9 mEq/L (ref 3.5–5.1)
Sodium: 137 mEq/L (ref 135–145)

## 2020-01-09 LAB — IBC PANEL
Iron: 124 ug/dL (ref 42–165)
Saturation Ratios: 30.4 % (ref 20.0–50.0)
Transferrin: 291 mg/dL (ref 212.0–360.0)

## 2020-01-09 LAB — LIPID PANEL
Cholesterol: 137 mg/dL (ref 0–200)
HDL: 35.9 mg/dL — ABNORMAL LOW (ref >39.00)
NonHDL: 101.26
Total CHOL/HDL Ratio: 4
Triglycerides: 230 mg/dL — ABNORMAL HIGH (ref 0.0–149.0)
VLDL: 46 mg/dL — ABNORMAL HIGH (ref 0.0–40.0)

## 2020-01-09 LAB — VITAMIN B12: Vitamin B-12: 355 pg/mL (ref 211–911)

## 2020-01-09 LAB — PSA: PSA: 0.27 ng/mL (ref 0.10–4.00)

## 2020-01-09 LAB — LDL CHOLESTEROL, DIRECT: Direct LDL: 77 mg/dL

## 2020-01-09 NOTE — Telephone Encounter (Signed)
Can you advise in PCP absence?  Last filled was 12/08/2019 Last visit with PCP was July of 2020

## 2020-01-09 NOTE — Telephone Encounter (Signed)
I don't see any prior UDS and last visit was virtual only so may need visit for refill. Please call to come for UDS and then forward back to me once this is collected for refill

## 2020-01-09 NOTE — Telephone Encounter (Signed)
This is a duplicate request.   Can you refuse this one please?

## 2020-01-10 ENCOUNTER — Other Ambulatory Visit (INDEPENDENT_AMBULATORY_CARE_PROVIDER_SITE_OTHER): Payer: BC Managed Care – PPO

## 2020-01-10 DIAGNOSIS — R739 Hyperglycemia, unspecified: Secondary | ICD-10-CM

## 2020-01-10 LAB — HEMOGLOBIN A1C: Hgb A1c MFr Bld: 5.3 % (ref 4.6–6.5)

## 2020-01-11 ENCOUNTER — Other Ambulatory Visit: Payer: Self-pay | Admitting: Internal Medicine

## 2020-01-11 NOTE — Telephone Encounter (Signed)
Check [place] registry last filled 12/08/2019. MD is out of the office pls advise on refill.Marland KitchenJohny Chess

## 2020-01-12 ENCOUNTER — Encounter: Payer: Self-pay | Admitting: Internal Medicine

## 2020-01-12 MED ORDER — METHYLPHENIDATE HCL ER (OSM) 36 MG PO TBCR: 36.0000 mg | EXTENDED_RELEASE_TABLET | Freq: Every day | ORAL | 0 refills | Status: DC

## 2020-02-03 DIAGNOSIS — F900 Attention-deficit hyperactivity disorder, predominantly inattentive type: Secondary | ICD-10-CM | POA: Diagnosis not present

## 2020-02-03 DIAGNOSIS — F419 Anxiety disorder, unspecified: Secondary | ICD-10-CM | POA: Diagnosis not present

## 2020-02-09 ENCOUNTER — Other Ambulatory Visit: Payer: Self-pay | Admitting: Internal Medicine

## 2020-02-10 MED ORDER — METHYLPHENIDATE HCL ER (OSM) 36 MG PO TBCR: 36.0000 mg | EXTENDED_RELEASE_TABLET | Freq: Every day | ORAL | 0 refills | Status: DC

## 2020-02-10 NOTE — Telephone Encounter (Signed)
Done erx  Please remind pt - due for rov June 2021

## 2020-02-17 DIAGNOSIS — F900 Attention-deficit hyperactivity disorder, predominantly inattentive type: Secondary | ICD-10-CM | POA: Diagnosis not present

## 2020-02-17 DIAGNOSIS — F4321 Adjustment disorder with depressed mood: Secondary | ICD-10-CM | POA: Diagnosis not present

## 2020-02-17 DIAGNOSIS — F419 Anxiety disorder, unspecified: Secondary | ICD-10-CM | POA: Diagnosis not present

## 2020-03-12 ENCOUNTER — Other Ambulatory Visit: Payer: Self-pay | Admitting: Internal Medicine

## 2020-03-13 MED ORDER — METHYLPHENIDATE HCL ER (OSM) 36 MG PO TBCR: 36.0000 mg | EXTENDED_RELEASE_TABLET | Freq: Every day | ORAL | 0 refills | Status: DC

## 2020-03-13 NOTE — Telephone Encounter (Signed)
Done erx  Ok to let pt know needs ROV for further refills

## 2020-03-13 NOTE — Telephone Encounter (Signed)
1.Medication Requested:methylphenidate (CONCERTA) 36 MG PO CR tablet  2. Pharmacy (Name, Street, City):[id number] - [place], [place] [address]  3. On Med List:  Yes   4. Last Visit with PCP: 6.16.20  5. Next visit date with PCP: n/a   Agent: Please be advised that RX refills may take up to 3 business days. We ask that you follow-up with your pharmacy.

## 2020-04-10 DIAGNOSIS — F419 Anxiety disorder, unspecified: Secondary | ICD-10-CM | POA: Diagnosis not present

## 2020-04-10 DIAGNOSIS — F4321 Adjustment disorder with depressed mood: Secondary | ICD-10-CM | POA: Diagnosis not present

## 2020-04-10 DIAGNOSIS — F900 Attention-deficit hyperactivity disorder, predominantly inattentive type: Secondary | ICD-10-CM | POA: Diagnosis not present

## 2020-04-12 ENCOUNTER — Other Ambulatory Visit: Payer: Self-pay | Admitting: Internal Medicine

## 2020-04-13 MED ORDER — METHYLPHENIDATE HCL ER (OSM) 36 MG PO TBCR: 36.0000 mg | EXTENDED_RELEASE_TABLET | Freq: Every day | ORAL | 0 refills | Status: DC

## 2020-04-13 NOTE — Telephone Encounter (Signed)
Done erx 

## 2020-04-16 ENCOUNTER — Other Ambulatory Visit: Payer: Self-pay | Admitting: Internal Medicine

## 2020-04-23 DIAGNOSIS — F4321 Adjustment disorder with depressed mood: Secondary | ICD-10-CM | POA: Diagnosis not present

## 2020-04-23 DIAGNOSIS — F419 Anxiety disorder, unspecified: Secondary | ICD-10-CM | POA: Diagnosis not present

## 2020-04-23 DIAGNOSIS — F900 Attention-deficit hyperactivity disorder, predominantly inattentive type: Secondary | ICD-10-CM | POA: Diagnosis not present

## 2020-05-11 ENCOUNTER — Other Ambulatory Visit: Payer: Self-pay | Admitting: Internal Medicine

## 2020-05-11 MED ORDER — METHYLPHENIDATE HCL ER (OSM) 36 MG PO TBCR: 36.0000 mg | EXTENDED_RELEASE_TABLET | Freq: Every day | ORAL | 0 refills | Status: DC

## 2020-05-11 NOTE — Telephone Encounter (Signed)
Done erx 

## 2020-06-11 ENCOUNTER — Other Ambulatory Visit: Payer: Self-pay | Admitting: Internal Medicine

## 2020-06-11 NOTE — Telephone Encounter (Signed)
Sorry, no longer able to rx concerta due to office refill poicy  Ok to let pt know

## 2020-06-13 ENCOUNTER — Encounter: Payer: Self-pay | Admitting: Internal Medicine

## 2020-06-13 ENCOUNTER — Other Ambulatory Visit: Payer: Self-pay | Admitting: Internal Medicine

## 2020-06-13 ENCOUNTER — Telehealth: Payer: Self-pay | Admitting: Internal Medicine

## 2020-06-13 MED ORDER — METHYLPHENIDATE HCL ER (OSM) 36 MG PO TBCR: 36.0000 mg | EXTENDED_RELEASE_TABLET | Freq: Every day | ORAL | 0 refills | Status: DC

## 2020-06-13 NOTE — Telephone Encounter (Signed)
Done erx4  Please let pt know - this is last rx due to office refill policy, due to rov

## 2020-06-13 NOTE — Telephone Encounter (Signed)
methylphenidate (CONCERTA) 36 MG PO CR tablet  [id number] - [place], [place] [address] Phone:  [phone]  Fax:  [phone]     Script was denied, patient has appt scheduled on 9.30.21, would like for his script to be filled now if possible

## 2020-06-14 NOTE — Telephone Encounter (Signed)
No early refills

## 2020-06-15 NOTE — Telephone Encounter (Signed)
Message left for patient today 

## 2020-06-18 DIAGNOSIS — F419 Anxiety disorder, unspecified: Secondary | ICD-10-CM | POA: Diagnosis not present

## 2020-06-18 DIAGNOSIS — F4321 Adjustment disorder with depressed mood: Secondary | ICD-10-CM | POA: Diagnosis not present

## 2020-06-18 DIAGNOSIS — F900 Attention-deficit hyperactivity disorder, predominantly inattentive type: Secondary | ICD-10-CM | POA: Diagnosis not present

## 2020-06-28 ENCOUNTER — Ambulatory Visit (INDEPENDENT_AMBULATORY_CARE_PROVIDER_SITE_OTHER): Payer: BC Managed Care – PPO | Admitting: Internal Medicine

## 2020-06-28 ENCOUNTER — Encounter: Payer: Self-pay | Admitting: Internal Medicine

## 2020-06-28 ENCOUNTER — Other Ambulatory Visit: Payer: Self-pay

## 2020-06-28 VITALS — BP 140/90 | HR 78 | Temp 98.3°F | Ht 73.0 in | Wt 236.0 lb

## 2020-06-28 DIAGNOSIS — I10 Essential (primary) hypertension: Secondary | ICD-10-CM

## 2020-06-28 DIAGNOSIS — Z0001 Encounter for general adult medical examination with abnormal findings: Secondary | ICD-10-CM

## 2020-06-28 DIAGNOSIS — R739 Hyperglycemia, unspecified: Secondary | ICD-10-CM | POA: Diagnosis not present

## 2020-06-28 DIAGNOSIS — M67432 Ganglion, left wrist: Secondary | ICD-10-CM | POA: Diagnosis not present

## 2020-06-28 DIAGNOSIS — Z23 Encounter for immunization: Secondary | ICD-10-CM | POA: Diagnosis not present

## 2020-06-28 NOTE — Progress Notes (Signed)
Subjective:    Patient ID: Kenneth Atkins, male    DOB: December 14, 1965, 54 y.o.   MRN: [id number]  HPI  Here for wellness and f/u;  Overall doing ok;  Pt denies Chest pain, worsening SOB, DOE, wheezing, orthopnea, PND, worsening LE edema, palpitations, dizziness or syncope.  Pt denies neurological change such as new headache, facial or extremity weakness.  Pt denies polydipsia, polyuria, or low sugar symptoms. Pt states overall good compliance with treatment and medications, good tolerability, and has been trying to follow appropriate diet.  Pt denies worsening depressive symptoms, suicidal ideation or panic. No fever, night sweats, wt loss, loss of appetite, or other constitutional symptoms.  Pt states good ability with ADL's, has low fall risk, home safety reviewed and adequate, no other significant changes in hearing or vision, and only occasionally active with exercise.  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 Allergy   . Arthritis   . Attention deficit disorder of adult 12/12/2008   Qualifier: Diagnosis of  By: Jenny Reichmann MD, Hunt Oris   . GERD (gastroesophageal reflux disease)   . Hyperlipidemia   . Hypertension   . Seizure disorder ([place])    None since 2001- pt states none since 2001- off Depakote    Past Surgical History:  Procedure Laterality Date  . CHOLECYSTECTOMY      reports that he quit smoking about 3 years ago. He smoked 0.50 packs per day. He has never used smokeless tobacco. He reports current alcohol use of about 12.0 standard drinks of alcohol per week. He reports that he does not use drugs. family history includes Alcohol abuse in his father; Hypertension in his brother and father. No Known Allergies Current Outpatient Medications on File Prior to Visit  Medication Sig Dispense Refill  . ketoconazole (NIZORAL)  2 % cream Apply 1 application topically daily. 30 g 1  . lisinopril (ZESTRIL) 20 MG tablet TAKE 1 TABLET BY MOUTH EVERY DAY 90 tablet 0  . meloxicam (MOBIC) 15 MG tablet Take 1 tablet (15 mg total) by mouth daily. **PT NEEDS APPOINTMENT FOR ADDITIONAL REFILLS** 30 tablet 0  . methylphenidate (CONCERTA) 36 MG PO CR tablet Take 1 tablet (36 mg total) by mouth daily. 30 tablet 0  . rosuvastatin (CRESTOR) 40 MG tablet TAKE 1 TABLET BY MOUTH EVERY DAY 90 tablet 0  . triamcinolone (NASACORT AQ) 55 MCG/ACT AERO nasal inhaler Place 2 sprays into the nose daily. 1 Inhaler 12   No current facility-administered medications on file prior to visit.   Review of Systems All otherwise neg per pt    Objective:   Physical Exam BP 140/90 (BP Location: Left Arm, Patient Position: Sitting, Cuff Size: Large)   Pulse 78   Temp 98.3 F (36.8 C) (Oral)   Ht 6\' 1"  (1.854 m)   Wt 236 lb (107 kg)   SpO2 97%   BMI 31.14 kg/m  VS noted,  Constitutional: Pt appears in NAD HENT: Head: NCAT.  Right Ear: External ear normal.  Left Ear: External ear normal.  Eyes: . Pupils are equal, round, and reactive to light. Conjunctivae and EOM are normal Nose: without d/c or deformity Neck: Neck supple. Gross normal ROM Cardiovascular: Normal rate and regular rhythm.   Pulmonary/Chest: Effort normal and breath sounds without rales or wheezing.  Abd:  Soft, NT, ND, + BS, no organomegaly Left post wrist with 1 cm area tender swelling cystic area Neurological: Pt is alert. At baseline orientation, motor grossly intact Skin: Skin is warm. No rashes, other new lesions, no LE edema Psychiatric: Pt behavior is normal without agitation  All otherwise neg per pt Lab Results  Component Value Date   WBC 7.3 01/09/2020   HGB 15.6 01/09/2020   HCT 44.3 01/09/2020   PLT 221.0 01/09/2020   GLUCOSE 131 (H) 01/09/2020   CHOL 137 01/09/2020   TRIG 230.0 (H) 01/09/2020   HDL 35.90 (L) 01/09/2020   LDLDIRECT 77.0 01/09/2020    LDLCALC 124 (H) 12/31/2016   ALT 67 (H) 01/09/2020   AST 33 01/09/2020   NA 137 01/09/2020   K 3.9 01/09/2020   CL 102 01/09/2020   CREATININE 0.82 01/09/2020   BUN 12 01/09/2020   CO2 25 01/09/2020   TSH 0.98 01/09/2020   PSA 0.27 01/09/2020   HGBA1C 5.3 01/10/2020      Assessment & Plan:

## 2020-06-28 NOTE — Patient Instructions (Addendum)
Please remember to have your booster next week  Please continue all other medications as before, and refills have been done if requested.  Please have the pharmacy call with any other refills you may need.  Please continue your efforts at being more active, low cholesterol diet, and weight control.  You are otherwise up to date with prevention measures today.  Please keep your appointments with your specialists as you may have planned  You will be contacted regarding the referral for: [facility] for the left wrist  Please make an Appointment to return for your 1 year visit, or sooner if needed

## 2020-06-30 ENCOUNTER — Encounter: Payer: Self-pay | Admitting: Internal Medicine

## 2020-06-30 NOTE — Assessment & Plan Note (Signed)

## 2020-06-30 NOTE — Assessment & Plan Note (Signed)
stable overall by history and exam, recent data reviewed with pt, and pt to continue medical treatment as before,  to f/u any worsening symptoms or concerns  

## 2020-06-30 NOTE — Assessment & Plan Note (Addendum)
Mild to mod, for refer ortho,  to f/u any worsening symptoms or concerns  I spent 21 minutes in preparing to see the patient by review of recent labs, imaging and procedures, obtaining and reviewing separately obtained history, communicating with the patient and family or caregiver, ordering medications, tests or procedures, and documenting clinical information in the EHR including the differential Dx, treatment, and any further evaluation and other management of left wrist ganglion cyst, htn, hyperglycemia

## 2020-07-02 DIAGNOSIS — F900 Attention-deficit hyperactivity disorder, predominantly inattentive type: Secondary | ICD-10-CM | POA: Diagnosis not present

## 2020-07-02 DIAGNOSIS — F4321 Adjustment disorder with depressed mood: Secondary | ICD-10-CM | POA: Diagnosis not present

## 2020-07-02 DIAGNOSIS — F419 Anxiety disorder, unspecified: Secondary | ICD-10-CM | POA: Diagnosis not present

## 2020-07-09 ENCOUNTER — Encounter: Payer: Self-pay | Admitting: Physician Assistant

## 2020-07-09 ENCOUNTER — Ambulatory Visit: Payer: BC Managed Care – PPO | Admitting: Physician Assistant

## 2020-07-09 DIAGNOSIS — M25532 Pain in left wrist: Secondary | ICD-10-CM

## 2020-07-09 NOTE — Progress Notes (Signed)
HPI: Mr. Kenneth Atkins returns today due to left wrist pain.  He states his pain is been worse over the last 6 to 8 weeks.  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  Notes whenever he is cutting and preparing food that he has increased pain in the left hand mostly dorsal aspect.  He states today that he is overall having a good day.  We last saw him in 2019 and sent him for an MRI of his left wrist and hand.  He however did not follow-up post MRI.  He states that he is now having 3-4 out of 10 pain that is constant in the dorsal aspect of the left wrist.  He notes some swelling dorsal aspect of the wrist at times.  He has had no new injury.  He is taking ibuprofen for pain. MRI of the left hand dated 10/26/2017 reviewed with the patient today.  This showed mild to moderate first metacarpal phalangeal joint arthritic changes.  Also slight arthritic changes at the second metacarpal phalangeal joint and the IP joint of the thumb. MRI left wrist dated 10/26/2017 showed extensive cystic degenerative changes of the wrists most prominent at the capitate hamate and lunate.  A ganglion cyst was seen in the volar aspect of the distal ulna.  Also complex ganglion cyst dorsal aspect at the articulation of the triquetrum and hamate.   Physical exam: General well-developed well-nourished male in no acute distress.  Mood and affect appropriate.  Psych alert and oriented x3 Left hand: He has full motor full sensation.  There is no rashes skin lesions ulcerations impending ulcers erythema or edema of the left wrist or hand.  He has no tenderness with palpation over the carpal bones.  Grind test is negative at the left thumb.  Nontender over the IP joint.  Palpable cyst volar aspect of distal ulna is developed with a.  Unable to palpate a cyst over the dorsal aspect of the wrist on exam today.   Impression: Degenerative changes left wrist  Plan: We will refer him to Dr. Burney Gauze for evaluation mostly to see if  there is any other treatments he would recommend at this point in time and also to establish a relationship with a hand surgeon.  Would recommend patient use Voltaren gel up to 2 g 4 times daily over the dorsal wrist.  Questions were encouraged and answered at length

## 2020-07-09 NOTE — Addendum Note (Signed)
Addended by: Michae Kava B on: 07/09/2020 01:28 PM   Modules accepted: Orders

## 2020-07-12 ENCOUNTER — Other Ambulatory Visit: Payer: Self-pay | Admitting: Internal Medicine

## 2020-07-12 DIAGNOSIS — M25532 Pain in left wrist: Secondary | ICD-10-CM | POA: Diagnosis not present

## 2020-07-12 DIAGNOSIS — M19032 Primary osteoarthritis, left wrist: Secondary | ICD-10-CM | POA: Diagnosis not present

## 2020-07-12 NOTE — Telephone Encounter (Signed)
Please refill as per office routine med refill policy (all routine meds refilled for 3 mo or monthly per pt preference up to one year from last visit, then month to month grace period for 3 mo, then further med refills will have to be denied)  

## 2020-07-16 ENCOUNTER — Other Ambulatory Visit: Payer: Self-pay | Admitting: Internal Medicine

## 2020-07-16 DIAGNOSIS — F900 Attention-deficit hyperactivity disorder, predominantly inattentive type: Secondary | ICD-10-CM | POA: Diagnosis not present

## 2020-07-16 DIAGNOSIS — F419 Anxiety disorder, unspecified: Secondary | ICD-10-CM | POA: Diagnosis not present

## 2020-07-16 DIAGNOSIS — F4321 Adjustment disorder with depressed mood: Secondary | ICD-10-CM | POA: Diagnosis not present

## 2020-07-16 MED ORDER — METHYLPHENIDATE HCL ER (OSM) 36 MG PO TBCR
36.0000 mg | EXTENDED_RELEASE_TABLET | Freq: Every day | ORAL | 0 refills | Status: DC
Start: 2020-07-16 — End: 2020-08-10

## 2020-07-16 NOTE — Telephone Encounter (Signed)
Done erx 

## 2020-07-16 NOTE — Telephone Encounter (Signed)
Please refill as per office routine med refill policy (all routine meds refilled for 3 mo or monthly per pt preference up to one year from last visit, then month to month grace period for 3 mo, then further med refills will have to be denied)  

## 2020-08-06 DIAGNOSIS — F900 Attention-deficit hyperactivity disorder, predominantly inattentive type: Secondary | ICD-10-CM | POA: Diagnosis not present

## 2020-08-06 DIAGNOSIS — F419 Anxiety disorder, unspecified: Secondary | ICD-10-CM | POA: Diagnosis not present

## 2020-08-06 DIAGNOSIS — F4321 Adjustment disorder with depressed mood: Secondary | ICD-10-CM | POA: Diagnosis not present

## 2020-08-10 ENCOUNTER — Other Ambulatory Visit: Payer: Self-pay | Admitting: Internal Medicine

## 2020-08-10 MED ORDER — LISINOPRIL 20 MG PO TABS
20.0000 mg | ORAL_TABLET | Freq: Every day | ORAL | 0 refills | Status: DC
Start: 2020-08-10 — End: 2021-02-05

## 2020-08-10 MED ORDER — ROSUVASTATIN CALCIUM 40 MG PO TABS
40.0000 mg | ORAL_TABLET | Freq: Every day | ORAL | 0 refills | Status: DC
Start: 2020-08-10 — End: 2021-02-05

## 2020-08-13 MED ORDER — METHYLPHENIDATE HCL ER (OSM) 36 MG PO TBCR
36.0000 mg | EXTENDED_RELEASE_TABLET | Freq: Every day | ORAL | 0 refills | Status: DC
Start: 2020-08-13 — End: 2020-09-17

## 2020-09-13 ENCOUNTER — Other Ambulatory Visit: Payer: Self-pay | Admitting: Internal Medicine

## 2020-09-17 ENCOUNTER — Other Ambulatory Visit: Payer: Self-pay | Admitting: Internal Medicine

## 2020-09-24 MED ORDER — METHYLPHENIDATE HCL ER (OSM) 36 MG PO TBCR
36.0000 mg | EXTENDED_RELEASE_TABLET | Freq: Every day | ORAL | 0 refills | Status: DC
Start: 2020-09-24 — End: 2020-10-21

## 2020-09-24 NOTE — Telephone Encounter (Signed)
Done erx 

## 2020-09-24 NOTE — Telephone Encounter (Signed)
Patient is completely out of medication

## 2020-10-21 ENCOUNTER — Other Ambulatory Visit: Payer: Self-pay | Admitting: Internal Medicine

## 2020-10-26 MED ORDER — METHYLPHENIDATE HCL ER (OSM) 36 MG PO TBCR
36.0000 mg | EXTENDED_RELEASE_TABLET | Freq: Every day | ORAL | 0 refills | Status: DC
Start: 2020-10-26 — End: 2020-11-22

## 2020-10-26 NOTE — Telephone Encounter (Signed)
Patient wondering status of medication

## 2020-11-22 ENCOUNTER — Other Ambulatory Visit: Payer: Self-pay | Admitting: Internal Medicine

## 2020-11-22 MED ORDER — METHYLPHENIDATE HCL ER (OSM) 36 MG PO TBCR
36.0000 mg | EXTENDED_RELEASE_TABLET | Freq: Every day | ORAL | 0 refills | Status: DC
Start: 2020-11-22 — End: 2020-12-22

## 2020-12-22 ENCOUNTER — Other Ambulatory Visit: Payer: Self-pay | Admitting: Internal Medicine

## 2020-12-26 ENCOUNTER — Other Ambulatory Visit: Payer: Self-pay | Admitting: Internal Medicine

## 2020-12-31 MED ORDER — METHYLPHENIDATE HCL ER (OSM) 36 MG PO TBCR
36.0000 mg | EXTENDED_RELEASE_TABLET | Freq: Every day | ORAL | 0 refills | Status: DC
Start: 2020-12-31 — End: 2021-02-20

## 2021-01-02 ENCOUNTER — Encounter: Payer: Self-pay | Admitting: Internal Medicine

## 2021-01-02 ENCOUNTER — Ambulatory Visit (INDEPENDENT_AMBULATORY_CARE_PROVIDER_SITE_OTHER): Payer: BC Managed Care – PPO | Admitting: Internal Medicine

## 2021-01-02 ENCOUNTER — Other Ambulatory Visit: Payer: Self-pay

## 2021-01-02 VITALS — BP 128/80 | HR 68 | Ht 73.0 in | Wt 233.6 lb

## 2021-01-02 DIAGNOSIS — Z1159 Encounter for screening for other viral diseases: Secondary | ICD-10-CM | POA: Diagnosis not present

## 2021-01-02 DIAGNOSIS — Z0001 Encounter for general adult medical examination with abnormal findings: Secondary | ICD-10-CM | POA: Diagnosis not present

## 2021-01-02 DIAGNOSIS — E559 Vitamin D deficiency, unspecified: Secondary | ICD-10-CM | POA: Diagnosis not present

## 2021-01-02 DIAGNOSIS — E7800 Pure hypercholesterolemia, unspecified: Secondary | ICD-10-CM

## 2021-01-02 DIAGNOSIS — F410 Panic disorder [episodic paroxysmal anxiety] without agoraphobia: Secondary | ICD-10-CM

## 2021-01-02 DIAGNOSIS — R739 Hyperglycemia, unspecified: Secondary | ICD-10-CM

## 2021-01-02 DIAGNOSIS — I10 Essential (primary) hypertension: Secondary | ICD-10-CM

## 2021-01-02 LAB — BASIC METABOLIC PANEL
BUN: 11 mg/dL (ref 6–23)
CO2: 26 mEq/L (ref 19–32)
Calcium: 9.8 mg/dL (ref 8.4–10.5)
Chloride: 103 mEq/L (ref 96–112)
Creatinine, Ser: 0.75 mg/dL (ref 0.40–1.50)
GFR: 103.69 mL/min (ref >60.00)
Glucose, Bld: 100 mg/dL — ABNORMAL HIGH (ref 70–99)
Potassium: 4.2 mEq/L (ref 3.5–5.1)
Sodium: 139 mEq/L (ref 135–145)

## 2021-01-02 LAB — CBC WITH DIFFERENTIAL/PLATELET
Basophils Absolute: 0.1 10*3/uL (ref 0.0–0.1)
Basophils Relative: 1.6 % (ref 0.0–3.0)
Eosinophils Absolute: 0.2 10*3/uL (ref 0.0–0.7)
Eosinophils Relative: 2.6 % (ref 0.0–5.0)
HCT: 46 % (ref 39.0–52.0)
Hemoglobin: 15.9 g/dL (ref 13.0–17.0)
Lymphocytes Relative: 32.6 % (ref 12.0–46.0)
Lymphs Abs: 2.2 10*3/uL (ref 0.7–4.0)
MCHC: 34.5 g/dL (ref 30.0–36.0)
MCV: 89.2 fl (ref 78.0–100.0)
Monocytes Absolute: 0.6 10*3/uL (ref 0.1–1.0)
Monocytes Relative: 9.5 % (ref 3.0–12.0)
Neutro Abs: 3.6 10*3/uL (ref 1.4–7.7)
Neutrophils Relative %: 53.7 % (ref 43.0–77.0)
Platelets: 234 10*3/uL (ref 150.0–400.0)
RBC: 5.15 Mil/uL (ref 4.22–5.81)
RDW: 13.1 % (ref 11.5–15.5)
WBC: 6.7 10*3/uL (ref 4.0–10.5)

## 2021-01-02 LAB — URINALYSIS, ROUTINE W REFLEX MICROSCOPIC
Bilirubin Urine: NEGATIVE
Hgb urine dipstick: NEGATIVE
Ketones, ur: NEGATIVE
Leukocytes,Ua: NEGATIVE
Nitrite: NEGATIVE
RBC / HPF: NONE SEEN (ref 0–?)
Specific Gravity, Urine: >=1.03 — AB (ref 1.000–1.030)
Total Protein, Urine: NEGATIVE
Urine Glucose: NEGATIVE
Urobilinogen, UA: 1 (ref 0.0–1.0)
pH: 5.5 (ref 5.0–8.0)

## 2021-01-02 LAB — HEPATIC FUNCTION PANEL
ALT: 72 U/L — ABNORMAL HIGH (ref 0–53)
AST: 33 U/L (ref 0–37)
Albumin: 4.5 g/dL (ref 3.5–5.2)
Alkaline Phosphatase: 59 U/L (ref 39–117)
Bilirubin, Direct: 0.1 mg/dL (ref 0.0–0.3)
Total Bilirubin: 0.4 mg/dL (ref 0.2–1.2)
Total Protein: 7.1 g/dL (ref 6.0–8.3)

## 2021-01-02 LAB — LIPID PANEL
Cholesterol: 136 mg/dL (ref 0–200)
HDL: 41.6 mg/dL (ref >39.00)
LDL Cholesterol: 76 mg/dL (ref 0–99)
NonHDL: 94.21
Total CHOL/HDL Ratio: 3
Triglycerides: 91 mg/dL (ref 0.0–149.0)
VLDL: 18.2 mg/dL (ref 0.0–40.0)

## 2021-01-02 LAB — TSH: TSH: 1.22 u[IU]/mL (ref 0.35–4.50)

## 2021-01-02 LAB — VITAMIN D 25 HYDROXY (VIT D DEFICIENCY, FRACTURES): VITD: 29.95 ng/mL — ABNORMAL LOW (ref 30.00–100.00)

## 2021-01-02 LAB — PSA: PSA: 0.31 ng/mL (ref 0.10–4.00)

## 2021-01-02 LAB — HEMOGLOBIN A1C: Hgb A1c MFr Bld: 5.5 % (ref 4.6–6.5)

## 2021-01-02 MED ORDER — CITALOPRAM HYDROBROMIDE 10 MG PO TABS: 10.0000 mg | ORAL_TABLET | Freq: Every day | ORAL | 3 refills | Status: DC

## 2021-01-02 MED ORDER — THERA-D 2000 50 MCG (2000 UT) PO TABS: ORAL_TABLET | ORAL | 99 refills | Status: DC

## 2021-01-02 NOTE — Progress Notes (Signed)
Patient ID: Kenneth Atkins, male   DOB: Aug 24, 1966, 55 y.o.   MRN: [id number]         Chief Complaint:: wellness exam and panic attacks       HPI:  Kenneth Atkins is a 55 y.o. male here for wellness exam; due for hep c screen, and plans to get the covid booster later today.  O/w up to date with preventive referrals and immunizations.  Never had the covid infection.                          Also with 6-12 mo increased anxiety panic attacks that turn into dry heaves after they really get going, but for some reason after the heaves feels so much better. 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  Then after restarted still wondering the business was going to be ongoing successful    Has seen counseling, but asks for psychiatry referral as well.  Also saw hand surgury for left wrist ganglion cyst , and pain improved with cortisone and wearing hard brace at night, but has deferring surgury until it gets worse.  Not taking VIT D.  Pt denies chest pain, increased sob or doe, wheezing, orthopnea, PND, increased LE swelling, palpitations, dizziness or syncope.  Denies new worsening focal neuro s/s.   Pt denies polydipsia, polyuria,  Pt denies fever, night sweats, loss of appetite, or other constitutional symptoms  Wt Readings from Last 3 Encounters:  01/02/21 233 lb 9.6 oz (106 kg)  06/28/20 236 lb (107 kg)  08/17/18 236 lb (107 kg)   BP Readings from Last 3 Encounters:  01/02/21 128/80  06/28/20 140/90  08/17/18 (!) 135/92   Immunization History  Administered Date(s) Administered  . Influenza Inj Mdck Quad Pf 11/30/2018  . Influenza,inj,Quad PF,6+ Mos 07/07/2013, 07/08/2019, 06/28/2020  . Influenza-Unspecified 12/01/2018  . PFIZER(Purple Top)SARS-COV-2 Vaccination 11/30/2019, 12/28/2019  . Tdap 12/31/2016  There are no preventive care reminders to display for this patient.     Past Medical History:  Diagnosis Date  . Allergy   . Arthritis   . Attention deficit disorder of adult 12/12/2008   Qualifier: Diagnosis of  By: Jenny Reichmann MD, Hunt Oris   . GERD (gastroesophageal reflux disease)   . Hyperlipidemia   . Hypertension   . Seizure disorder ([place])    None since 2001- pt states none since 2001- off Depakote    Past Surgical History:  Procedure Laterality Date  . CHOLECYSTECTOMY      reports that he quit smoking about 4 years ago. He smoked 0.50 packs per day. He has never used smokeless tobacco. He reports current alcohol use of about 12.0 standard drinks of alcohol per week. He reports that he does not use drugs. family history includes Alcohol abuse in his father; Hypertension in his brother and father. No Known Allergies Current Outpatient Medications on File Prior to Visit  Medication Sig Dispense Refill  . ketoconazole (NIZORAL) 2 % cream Apply 1 application topically daily. 30 g 1  . lisinopril (ZESTRIL) 20 MG tablet Take 1 tablet (20 mg total) by mouth daily. 90 tablet 0  . methylphenidate (CONCERTA) 36 MG PO CR tablet Take 1 tablet (36 mg total) by mouth daily. 30 tablet 0  . rosuvastatin (CRESTOR) 40 MG tablet Take 1 tablet (40 mg total) by mouth daily. [place]  tablet 0  . triamcinolone (NASACORT AQ) 55 MCG/ACT AERO nasal inhaler Place 2 sprays into the nose daily. 1 Inhaler 12  . meloxicam (MOBIC) 15 MG tablet Take 1 tablet (15 mg total) by mouth daily. **PT NEEDS APPOINTMENT FOR ADDITIONAL REFILLS** (Patient not taking: Reported on 01/02/2021) 30 tablet 0   No current facility-administered medications on file prior to visit.        ROS:  All others reviewed and negative.  Objective        PE:  BP 128/80 (BP Location: Left Arm, Patient Position: Sitting, Cuff Size: Large)   Pulse 68   Ht 6\' 1"  (1.854 m)   Wt 233 lb 9.6 oz (106 kg)   SpO2 99%   BMI 30.82 kg/m                 Constitutional: Pt appears in NAD               HENT: Head: NCAT.                 Right Ear: External ear normal.                 Left Ear: External ear normal.                Eyes: . Pupils are equal, round, and reactive to light. Conjunctivae and EOM are normal               Nose: without d/c or deformity               Neck: Neck supple. Gross normal ROM               Cardiovascular: Normal rate and regular rhythm.                 Pulmonary/Chest: Effort normal and breath sounds without rales or wheezing.                Abd:  Soft, NT, ND, + BS, no organomegaly               Neurological: Pt is alert. At baseline orientation, motor grossly intact               Skin: Skin is warm. No rashes, no other new lesions, LE edema - none               Psychiatric: Pt behavior is normal without agitation   Micro: none  Cardiac tracings I have personally interpreted today:  none  Pertinent Radiological findings (summarize): none   Lab Results  Component Value Date   WBC 6.7 01/02/2021   HGB 15.9 01/02/2021   HCT 46.0 01/02/2021   PLT 234.0 01/02/2021   GLUCOSE 100 (H) 01/02/2021   CHOL 136 01/02/2021   TRIG 91.0 01/02/2021   HDL 41.60 01/02/2021   LDLDIRECT 77.0 01/09/2020   LDLCALC 76 01/02/2021   ALT 72 (H) 01/02/2021   AST 33 01/02/2021   NA 139 01/02/2021   K 4.2 01/02/2021   CL 103 01/02/2021   CREATININE 0.75 01/02/2021   BUN 11 01/02/2021   CO2 26 01/02/2021   TSH 1.22 01/02/2021   PSA 0.31 01/02/2021   HGBA1C 5.5 01/02/2021   Assessment/Plan:  Kenneth Atkins is a 55 y.o. White or Caucasian [1] male with  has a past medical history of Allergy, Arthritis, Attention deficit disorder of adult (12/12/2008), GERD (gastroesophageal reflux disease), Hyperlipidemia, Hypertension, and Seizure disorder ([place]).  Encounter for well adult exam with abnormal findings Age and sex appropriate education and counseling updated with regular exercise and diet Referrals for preventative services - for hep c screen with labs Immunizations addressed - for covid booster  later today Smoking counseling  - none needed Evidence for depression or other mood disorder - none significant Most recent labs reviewed. I have personally reviewed and have noted: 1) the patient's medical and social history 2) The patient's current medications and supplements 3) The patient's height, weight, and BMI have been recorded in the chart   Vitamin D deficiency Last vitamin D Lab Results  Component Value Date   VD25OH 29.95 (L) 01/02/2021   Low, to start oral replacement   Panic anxiety syndrome Uncontrolled, severe chronic, for start celexa 10 qd and psychiatry referral  Hyperlipidemia Lab Results  Component Value Date   [place] 76 01/02/2021   Stable, pt to continue current statin crestor 40   Hyperglycemia Lab Results  Component Value Date   HGBA1C 5.5 01/02/2021   Stable, pt to continue current medical treatment  - diet   Essential hypertension BP Readings from Last 3 Encounters:  01/02/21 128/80  06/28/20 140/90  08/17/18 (!) 135/92   Stable, pt to continue medical treatment lisinopril 20   Followup: Return in about 6 months (around 07/04/2021).  Cathlean Cower, MD 01/07/2021 4:29 AM [facility]

## 2021-01-02 NOTE — Patient Instructions (Signed)
Please take all new medication as prescribed - the celexa 10 mg per day  Please take OTC Vitamin D3 at 2000 units per day, indefinitely.  Please continue all other medications as before, and refills have been done if requested.  Please have the pharmacy call with any other refills you may need.  Please continue your efforts at being more active, low cholesterol diet, and weight control.  You are otherwise up to date with prevention measures today.  Please keep your appointments with your specialists as you may have planned  Please go to the LAB at the blood drawing area for the tests to be done  You will be contacted by phone if any changes need to be made immediately.  Otherwise, you will receive a letter about your results with an explanation, but please check with MyChart first.  Please remember to sign up for MyChart if you have not done so, as this will be important to you in the future with finding out test results, communicating by private email, and scheduling acute appointments online when needed.  Please make an Appointment to return in 6 months, or sooner if needed, also with Lab Appointment for testing done 3-5 days before at the [facility] (so this is for TWO appointments - please see the scheduling desk as you leave)  Due to the ongoing Covid 19 pandemic, our lab now requires an appointment for any labs done at our office.  If you need labs done and do not have an appointment, please call our office ahead of time to schedule before presenting to the lab for your testing.

## 2021-01-03 LAB — HEPATITIS C ANTIBODY
Hepatitis C Ab: NONREACTIVE
SIGNAL TO CUT-OFF: 0 (ref <1.00)

## 2021-01-07 ENCOUNTER — Encounter: Payer: Self-pay | Admitting: Internal Medicine

## 2021-01-07 NOTE — Assessment & Plan Note (Signed)
Lab Results  Component Value Date   HGBA1C 5.5 01/02/2021   Stable, pt to continue current medical treatment  - diet

## 2021-01-07 NOTE — Assessment & Plan Note (Signed)
Last vitamin D Lab Results  Component Value Date   VD25OH 29.95 (L) 01/02/2021   Low, to start oral replacement

## 2021-01-07 NOTE — Assessment & Plan Note (Signed)
BP Readings from Last 3 Encounters:  01/02/21 128/80  06/28/20 140/90  08/17/18 (!) 135/92   Stable, pt to continue medical treatment lisinopril 20

## 2021-01-07 NOTE — Assessment & Plan Note (Addendum)
Uncontrolled, severe chronic, for start celexa 10 qd and psychiatry referral

## 2021-01-07 NOTE — Assessment & Plan Note (Signed)
Lab Results  Component Value Date   LDLCALC 76 01/02/2021   Stable, pt to continue current statin crestor 40

## 2021-01-07 NOTE — Assessment & Plan Note (Signed)
Age and sex appropriate education and counseling updated with regular exercise and diet Referrals for preventative services - for hep c screen with labs Immunizations addressed - for covid booster later today Smoking counseling  - none needed Evidence for depression or other mood disorder - none significant Most recent labs reviewed. I have personally reviewed and have noted: 1) the patient's medical and social history 2) The patient's current medications and supplements 3) The patient's height, weight, and BMI have been recorded in the chart

## 2021-01-24 DIAGNOSIS — F900 Attention-deficit hyperactivity disorder, predominantly inattentive type: Secondary | ICD-10-CM | POA: Diagnosis not present

## 2021-01-24 DIAGNOSIS — F4320 Adjustment disorder, unspecified: Secondary | ICD-10-CM | POA: Diagnosis not present

## 2021-01-29 DIAGNOSIS — F4320 Adjustment disorder, unspecified: Secondary | ICD-10-CM | POA: Diagnosis not present

## 2021-01-29 DIAGNOSIS — F900 Attention-deficit hyperactivity disorder, predominantly inattentive type: Secondary | ICD-10-CM | POA: Diagnosis not present

## 2021-02-02 ENCOUNTER — Other Ambulatory Visit: Payer: Self-pay | Admitting: Internal Medicine

## 2021-02-02 NOTE — Telephone Encounter (Signed)
Please refill as per office routine med refill policy (all routine meds refilled for 3 mo or monthly per pt preference up to one year from last visit, then month to month grace period for 3 mo, then further med refills will have to be denied)  

## 2021-02-04 ENCOUNTER — Other Ambulatory Visit: Payer: Self-pay | Admitting: Internal Medicine

## 2021-02-11 ENCOUNTER — Other Ambulatory Visit: Payer: Self-pay | Admitting: Internal Medicine

## 2021-02-14 DIAGNOSIS — F900 Attention-deficit hyperactivity disorder, predominantly inattentive type: Secondary | ICD-10-CM | POA: Diagnosis not present

## 2021-02-14 DIAGNOSIS — F4320 Adjustment disorder, unspecified: Secondary | ICD-10-CM | POA: Diagnosis not present

## 2021-02-20 ENCOUNTER — Other Ambulatory Visit: Payer: Self-pay | Admitting: Internal Medicine

## 2021-02-21 MED ORDER — METHYLPHENIDATE HCL ER (OSM) 36 MG PO TBCR: 36.0000 mg | EXTENDED_RELEASE_TABLET | Freq: Every day | ORAL | 0 refills | Status: DC

## 2021-02-21 NOTE — Telephone Encounter (Signed)
Patient following up on refill request submitted. Advised patient that refills may take up to 3 business days

## 2021-02-21 NOTE — Telephone Encounter (Signed)
Requesting Concerta Last OV: 11/05/76 Next OV: none  Last Filled: 12/31/20  PMP done; please advise

## 2021-03-22 ENCOUNTER — Other Ambulatory Visit: Payer: Self-pay | Admitting: Internal Medicine

## 2021-03-29 ENCOUNTER — Other Ambulatory Visit: Payer: Self-pay | Admitting: Internal Medicine

## 2021-04-05 MED ORDER — METHYLPHENIDATE HCL ER (OSM) 36 MG PO TBCR: 36.0000 mg | EXTENDED_RELEASE_TABLET | Freq: Every day | ORAL | 0 refills | Status: DC

## 2021-04-05 NOTE — Telephone Encounter (Signed)
1.Medication Requested: methylphenidate (CONCERTA) 36 MG PO CR tablet  2. Pharmacy (Name, Street, City):[id number] - [place],  - [place]  3. On Med List: yes   4. Last Visit with PCP: 06-28-20  5. Next visit date with PCP: n/a    Agent: Please be advised that RX refills may take up to 3 business days. We ask that you follow-up with your pharmacy.

## 2021-05-04 ENCOUNTER — Other Ambulatory Visit: Payer: Self-pay | Admitting: Internal Medicine

## 2021-05-07 ENCOUNTER — Other Ambulatory Visit: Payer: Self-pay | Admitting: Internal Medicine

## 2021-05-10 ENCOUNTER — Telehealth: Payer: Self-pay

## 2021-05-10 ENCOUNTER — Other Ambulatory Visit: Payer: Self-pay

## 2021-05-10 MED ORDER — METHYLPHENIDATE HCL ER (OSM) 36 MG PO TBCR: 36.0000 mg | EXTENDED_RELEASE_TABLET | Freq: Every day | ORAL | 0 refills | Status: DC

## 2021-05-10 NOTE — Telephone Encounter (Signed)
pt has called stating he needs a rx refill for methylphenidate (CONCERTA) 36 MG PO CR tablet. Pt was last seen on 01/02/2021.

## 2021-05-30 DIAGNOSIS — F900 Attention-deficit hyperactivity disorder, predominantly inattentive type: Secondary | ICD-10-CM | POA: Diagnosis not present

## 2021-06-05 ENCOUNTER — Other Ambulatory Visit: Payer: Self-pay | Admitting: Internal Medicine

## 2021-06-06 MED ORDER — METHYLPHENIDATE HCL ER (OSM) 36 MG PO TBCR: 36.0000 mg | EXTENDED_RELEASE_TABLET | Freq: Every day | ORAL | 0 refills | Status: DC

## 2021-06-10 ENCOUNTER — Other Ambulatory Visit: Payer: Self-pay | Admitting: Internal Medicine

## 2021-06-10 NOTE — Telephone Encounter (Signed)
Please refill as per office routine med refill policy (all routine meds to be refilled for 3 mo or monthly (per pt preference) up to one year from last visit, then month to month grace period for 3 mo, then further med refills will have to be denied) ? ?

## 2021-07-09 ENCOUNTER — Other Ambulatory Visit: Payer: Self-pay | Admitting: Internal Medicine

## 2021-07-09 MED ORDER — METHYLPHENIDATE HCL ER (OSM) 36 MG PO TBCR: 36.0000 mg | EXTENDED_RELEASE_TABLET | Freq: Every day | ORAL | 0 refills | Status: DC

## 2021-08-06 ENCOUNTER — Other Ambulatory Visit: Payer: Self-pay | Admitting: Internal Medicine

## 2021-08-06 NOTE — Telephone Encounter (Signed)
Please refill as per office routine med refill policy (all routine meds to be refilled for 3 mo or monthly (per pt preference) up to one year from last visit, then month to month grace period for 3 mo, then further med refills will have to be denied) ? ?

## 2021-08-08 ENCOUNTER — Other Ambulatory Visit: Payer: Self-pay | Admitting: Internal Medicine

## 2021-08-08 MED ORDER — METHYLPHENIDATE HCL ER (OSM) 36 MG PO TBCR: 36.0000 mg | EXTENDED_RELEASE_TABLET | Freq: Every day | ORAL | 0 refills | Status: DC

## 2021-08-13 DIAGNOSIS — M19032 Primary osteoarthritis, left wrist: Secondary | ICD-10-CM | POA: Diagnosis not present

## 2021-08-25 ENCOUNTER — Other Ambulatory Visit: Payer: Self-pay | Admitting: Internal Medicine

## 2021-08-27 ENCOUNTER — Encounter: Payer: Self-pay | Admitting: Internal Medicine

## 2021-08-27 MED ORDER — KETOCONAZOLE 200 MG PO TABS: 200.0000 mg | ORAL_TABLET | Freq: Two times a day (BID) | ORAL | 0 refills | Status: AC

## 2021-09-01 ENCOUNTER — Other Ambulatory Visit: Payer: Self-pay | Admitting: Internal Medicine

## 2021-09-01 NOTE — Telephone Encounter (Signed)
Please refill as per office routine med refill policy (all routine meds to be refilled for 3 mo or monthly (per pt preference) up to one year from last visit, then month to month grace period for 3 mo, then further med refills will have to be denied) ? ?

## 2021-09-06 ENCOUNTER — Other Ambulatory Visit: Payer: Self-pay | Admitting: Internal Medicine

## 2021-09-06 MED ORDER — METHYLPHENIDATE HCL ER (OSM) 36 MG PO TBCR: 36.0000 mg | EXTENDED_RELEASE_TABLET | Freq: Every day | ORAL | 0 refills | Status: DC

## 2021-10-01 ENCOUNTER — Other Ambulatory Visit: Payer: Self-pay | Admitting: Internal Medicine

## 2021-10-01 NOTE — Telephone Encounter (Signed)
Please refill as per office routine med refill policy (all routine meds to be refilled for 3 mo or monthly (per pt preference) up to one year from last visit, then month to month grace period for 3 mo, then further med refills will have to be denied) ? ?

## 2021-10-07 ENCOUNTER — Other Ambulatory Visit: Payer: Self-pay | Admitting: Internal Medicine

## 2021-10-07 MED ORDER — METHYLPHENIDATE HCL ER (OSM) 36 MG PO TBCR: 36.0000 mg | EXTENDED_RELEASE_TABLET | Freq: Every day | ORAL | 0 refills | Status: DC

## 2021-10-24 ENCOUNTER — Other Ambulatory Visit: Payer: Self-pay | Admitting: Internal Medicine

## 2021-10-24 NOTE — Telephone Encounter (Signed)
Please refill as per office routine med refill policy (all routine meds to be refilled for 3 mo or monthly (per pt preference) up to one year from last visit, then month to month grace period for 3 mo, then further med refills will have to be denied) ? ?

## 2021-11-08 ENCOUNTER — Other Ambulatory Visit: Payer: Self-pay | Admitting: Internal Medicine

## 2021-11-08 MED ORDER — METHYLPHENIDATE HCL ER (OSM) 36 MG PO TBCR
36.0000 mg | EXTENDED_RELEASE_TABLET | Freq: Every day | ORAL | 0 refills | Status: DC
Start: 2021-11-08 — End: 2021-11-18

## 2021-11-18 ENCOUNTER — Encounter: Payer: Self-pay | Admitting: Internal Medicine

## 2021-11-18 MED ORDER — METHYLPHENIDATE HCL ER (OSM) 36 MG PO TBCR
36.0000 mg | EXTENDED_RELEASE_TABLET | Freq: Every day | ORAL | 0 refills | Status: DC
Start: 2021-11-18 — End: 2021-12-23

## 2021-12-03 DIAGNOSIS — Z1331 Encounter for screening for depression: Secondary | ICD-10-CM | POA: Diagnosis not present

## 2021-12-03 DIAGNOSIS — Z6833 Body mass index (BMI) 33.0-33.9, adult: Secondary | ICD-10-CM | POA: Diagnosis not present

## 2021-12-03 DIAGNOSIS — J069 Acute upper respiratory infection, unspecified: Secondary | ICD-10-CM | POA: Diagnosis not present

## 2021-12-03 DIAGNOSIS — Z20822 Contact with and (suspected) exposure to covid-19: Secondary | ICD-10-CM | POA: Diagnosis not present

## 2021-12-05 ENCOUNTER — Other Ambulatory Visit: Payer: Self-pay | Admitting: Internal Medicine

## 2021-12-05 NOTE — Telephone Encounter (Signed)
Please refill as per office routine med refill policy (all routine meds to be refilled for 3 mo or monthly (per pt preference) up to one year from last visit, then month to month grace period for 3 mo, then further med refills will have to be denied) ? ?

## 2021-12-06 MED ORDER — LISINOPRIL 20 MG PO TABS: 20.0000 mg | ORAL_TABLET | Freq: Every day | ORAL | 0 refills | Status: DC

## 2021-12-06 NOTE — Telephone Encounter (Signed)
Pt checking status of refill request ? ?Pts lov 01-02-2021 ?Pts nov 12-11-2021 ?

## 2021-12-11 ENCOUNTER — Other Ambulatory Visit: Payer: Self-pay

## 2021-12-11 ENCOUNTER — Ambulatory Visit (INDEPENDENT_AMBULATORY_CARE_PROVIDER_SITE_OTHER): Payer: BC Managed Care – PPO | Admitting: Internal Medicine

## 2021-12-11 ENCOUNTER — Ambulatory Visit (INDEPENDENT_AMBULATORY_CARE_PROVIDER_SITE_OTHER): Payer: BC Managed Care – PPO

## 2021-12-11 ENCOUNTER — Encounter: Payer: Self-pay | Admitting: Internal Medicine

## 2021-12-11 VITALS — BP 126/84 | HR 66 | Resp 18 | Ht 73.0 in | Wt 248.0 lb

## 2021-12-11 DIAGNOSIS — Z0001 Encounter for general adult medical examination with abnormal findings: Secondary | ICD-10-CM | POA: Diagnosis not present

## 2021-12-11 DIAGNOSIS — E7800 Pure hypercholesterolemia, unspecified: Secondary | ICD-10-CM

## 2021-12-11 DIAGNOSIS — E538 Deficiency of other specified B group vitamins: Secondary | ICD-10-CM

## 2021-12-11 DIAGNOSIS — R059 Cough, unspecified: Secondary | ICD-10-CM | POA: Diagnosis not present

## 2021-12-11 DIAGNOSIS — R739 Hyperglycemia, unspecified: Secondary | ICD-10-CM

## 2021-12-11 DIAGNOSIS — M25561 Pain in right knee: Secondary | ICD-10-CM | POA: Diagnosis not present

## 2021-12-11 DIAGNOSIS — R051 Acute cough: Secondary | ICD-10-CM

## 2021-12-11 DIAGNOSIS — Z125 Encounter for screening for malignant neoplasm of prostate: Secondary | ICD-10-CM | POA: Diagnosis not present

## 2021-12-11 DIAGNOSIS — I10 Essential (primary) hypertension: Secondary | ICD-10-CM

## 2021-12-11 DIAGNOSIS — F410 Panic disorder [episodic paroxysmal anxiety] without agoraphobia: Secondary | ICD-10-CM

## 2021-12-11 DIAGNOSIS — E559 Vitamin D deficiency, unspecified: Secondary | ICD-10-CM

## 2021-12-11 DIAGNOSIS — F32A Depression, unspecified: Secondary | ICD-10-CM

## 2021-12-11 LAB — LIPID PANEL
Cholesterol: 175 mg/dL (ref 0–200)
HDL: 38.5 mg/dL — ABNORMAL LOW (ref >39.00)
NonHDL: 136.47
Total CHOL/HDL Ratio: 5
Triglycerides: 236 mg/dL — ABNORMAL HIGH (ref 0.0–149.0)
VLDL: 47.2 mg/dL — ABNORMAL HIGH (ref 0.0–40.0)

## 2021-12-11 LAB — BASIC METABOLIC PANEL
BUN: 17 mg/dL (ref 6–23)
CO2: 32 mEq/L (ref 19–32)
Calcium: 9.8 mg/dL (ref 8.4–10.5)
Chloride: 103 mEq/L (ref 96–112)
Creatinine, Ser: 0.88 mg/dL (ref 0.40–1.50)
GFR: 98.15 mL/min (ref >60.00)
Glucose, Bld: 94 mg/dL (ref 70–99)
Potassium: 4 mEq/L (ref 3.5–5.1)
Sodium: 141 mEq/L (ref 135–145)

## 2021-12-11 LAB — CBC WITH DIFFERENTIAL/PLATELET
Basophils Absolute: 0.1 10*3/uL (ref 0.0–0.1)
Basophils Relative: 1.1 % (ref 0.0–3.0)
Eosinophils Absolute: 0.3 10*3/uL (ref 0.0–0.7)
Eosinophils Relative: 2.6 % (ref 0.0–5.0)
HCT: 47.4 % (ref 39.0–52.0)
Hemoglobin: 16.1 g/dL (ref 13.0–17.0)
Lymphocytes Relative: 24.9 % (ref 12.0–46.0)
Lymphs Abs: 2.5 10*3/uL (ref 0.7–4.0)
MCHC: 33.9 g/dL (ref 30.0–36.0)
MCV: 90.3 fl (ref 78.0–100.0)
Monocytes Absolute: 1 10*3/uL (ref 0.1–1.0)
Monocytes Relative: 9.9 % (ref 3.0–12.0)
Neutro Abs: 6.2 10*3/uL (ref 1.4–7.7)
Neutrophils Relative %: 61.5 % (ref 43.0–77.0)
Platelets: 331 10*3/uL (ref 150.0–400.0)
RBC: 5.25 Mil/uL (ref 4.22–5.81)
RDW: 13.1 % (ref 11.5–15.5)
WBC: 10.1 10*3/uL (ref 4.0–10.5)

## 2021-12-11 LAB — HEPATIC FUNCTION PANEL
ALT: 126 U/L — ABNORMAL HIGH (ref 0–53)
AST: 74 U/L — ABNORMAL HIGH (ref 0–37)
Albumin: 4.3 g/dL (ref 3.5–5.2)
Alkaline Phosphatase: 70 U/L (ref 39–117)
Bilirubin, Direct: 0.2 mg/dL (ref 0.0–0.3)
Total Bilirubin: 0.7 mg/dL (ref 0.2–1.2)
Total Protein: 7.1 g/dL (ref 6.0–8.3)

## 2021-12-11 LAB — URINALYSIS, ROUTINE W REFLEX MICROSCOPIC
Bilirubin Urine: NEGATIVE
Hgb urine dipstick: NEGATIVE
Ketones, ur: NEGATIVE
Leukocytes,Ua: NEGATIVE
Nitrite: NEGATIVE
RBC / HPF: NONE SEEN (ref 0–?)
Specific Gravity, Urine: >=1.03 — AB (ref 1.000–1.030)
Total Protein, Urine: NEGATIVE
Urine Glucose: NEGATIVE
Urobilinogen, UA: 2 — AB (ref 0.0–1.0)
pH: 6 (ref 5.0–8.0)

## 2021-12-11 LAB — VITAMIN B12: Vitamin B-12: 1023 pg/mL — ABNORMAL HIGH (ref 211–911)

## 2021-12-11 LAB — PSA: PSA: 0.16 ng/mL (ref 0.10–4.00)

## 2021-12-11 LAB — TSH: TSH: 0.82 u[IU]/mL (ref 0.35–5.50)

## 2021-12-11 LAB — HEMOGLOBIN A1C: Hgb A1c MFr Bld: 5.9 % (ref 4.6–6.5)

## 2021-12-11 LAB — LDL CHOLESTEROL, DIRECT: Direct LDL: 122 mg/dL

## 2021-12-11 LAB — VITAMIN D 25 HYDROXY (VIT D DEFICIENCY, FRACTURES): VITD: 36.05 ng/mL (ref 30.00–100.00)

## 2021-12-11 MED ORDER — HYDROCODONE BIT-HOMATROP MBR 5-1.5 MG/5ML PO SOLN: 5.0000 mL | Freq: Four times a day (QID) | ORAL | 0 refills | Status: AC | PRN

## 2021-12-11 MED ORDER — CEFDINIR 300 MG PO CAPS: 300.0000 mg | ORAL_CAPSULE | Freq: Two times a day (BID) | ORAL | 0 refills | Status: DC

## 2021-12-11 NOTE — Assessment & Plan Note (Signed)
Last vitamin D ?Lab Results  ?Component Value Date  ? VD25OH 29.95 (L) 01/02/2021  ? ?Low, to start oral replacement ? ?

## 2021-12-11 NOTE — Progress Notes (Signed)
Patient ID: Kenneth Atkins, male   DOB: Nov 04, 1965, 56 y.o.   MRN: [id number] ? ? ? ?     Chief Complaint:: wellness exam and Post Sinus Infection Cough (Received a Z-Pak last week. He feels a lot better today but he is still coughing up mucus.) and Right knee  (He also mentioned that his knee will swell up which makes it hard to bend) ?  ? ?     HPI:  Kenneth Atkins is a 56 y.o. male here for wellness exam , declines flu shot, covid bosoter, shingrix for now, o/w up to date ?         ?              Also has taken no ETOH for over 1 wk, feels much better overall, suprising himself.  Has 1 wk worsening right knee pain with intermittent swelling, limping, mild to mod, intermittent, worse to walk or bend, better to sit, has been worse recently with more standing related to brother died x 3 wks with overdose and several family gatherings related to this afterwards.  C/o worsening grief and anxiety since then but no increase ETOH or other illicit drug use.  Celexa has helped greatly and want to continue this,  Unfortunately, also, Here with acute onset mild to mod 3 wks ST, HA, general weakness and malaise, with prod cough greenish sputum, but Pt denies chest pain, increased sob or doe, wheezing, orthopnea, PND, increased LE swelling, palpitations, dizziness or syncope.   Feeling some improved today after seen at [facility] with zpack, prednisone, and mucinex; but then worse again in last 3 days.   ?  ?Wt Readings from Last 3 Encounters:  ?12/11/21 248 lb (112.5 kg)  ?01/02/21 233 lb 9.6 oz (106 kg)  ?06/28/20 236 lb (107 kg)  ? ?BP Readings from Last 3 Encounters:  ?12/11/21 126/84  ?01/02/21 128/80  ?06/28/20 140/90  ? ?Immunization History  ?Administered Date(s) Administered  ? Influenza Inj Mdck Quad Pf 11/30/2018  ? Influenza,inj,Quad PF,6+ Mos 07/07/2013, 07/08/2019, 06/28/2020  ? Influenza-Unspecified 12/01/2018  ? PFIZER(Purple Top)SARS-COV-2 Vaccination 11/30/2019, 12/28/2019  ? Tdap 12/31/2016  ?There are no  preventive care reminders to display for this patient. ?  ? ?Past Medical History:  ?Diagnosis Date  ? Allergy   ? Arthritis   ? Attention deficit disorder of adult 12/12/2008  ? Qualifier: Diagnosis of  By: Jenny Reichmann MD, Hunt Oris   ? GERD (gastroesophageal reflux disease)   ? Hyperlipidemia   ? Hypertension   ? Seizure disorder ([place])   ? None since 2001- pt states none since 2001- off Depakote   ? ?Past Surgical History:  ?Procedure Laterality Date  ? CHOLECYSTECTOMY    ? ? reports that he quit smoking about 5 years ago. He smoked an average of .5 packs per day. He has never used smokeless tobacco. He reports current alcohol use of about 12.0 standard drinks per week. He reports that he does not use drugs. ?family history includes Alcohol abuse in his father; Hypertension in his brother and father. ?No Known Allergies ?Current Outpatient Medications on File Prior to Visit  ?Medication Sig Dispense Refill  ? Cholecalciferol (THERA-D 2000) 50 MCG (2000 UT) TABS 1 tab by mouth once daily 30 tablet 99  ? citalopram (CELEXA) 10 MG tablet Take 1 tablet (10 mg total) by mouth daily. 90 tablet 3  ? ketoconazole (NIZORAL) 2 % cream Apply 1 application topically daily. 30 g 1  ? lisinopril (  ZESTRIL) 20 MG tablet Take 1 tablet (20 mg total) by mouth daily. 30 tablet 0  ? methylphenidate (CONCERTA) 36 MG PO CR tablet Take 1 tablet (36 mg total) by mouth daily. 30 tablet 0  ? rosuvastatin (CRESTOR) 40 MG tablet TAKE 1 TABLET BY MOUTH EVERY DAY 90 tablet 0  ? triamcinolone (NASACORT AQ) 55 MCG/ACT AERO nasal inhaler Place 2 sprays into the nose daily. 1 Inhaler 12  ? meloxicam (MOBIC) 15 MG tablet Take 1 tablet (15 mg total) by mouth daily. **PT NEEDS APPOINTMENT FOR ADDITIONAL REFILLS** (Patient not taking: Reported on 01/02/2021) 30 tablet 0  ? ?No current facility-administered medications on file prior to visit.  ? ?     ROS:  All others reviewed and negative. ? ?Objective  ? ?     PE:  BP 126/84   Pulse 66   Resp 18   Ht '6\' 1"'$   (1.854 m)   Wt 248 lb (112.5 kg)   SpO2 98%   BMI 32.72 kg/m?  ? ?              Constitutional: Pt appears in NAD ?              HENT: Head: NCAT.  ?              Right Ear: External ear normal.   ?              Left Ear: External ear normal.  ?              Eyes: . Pupils are equal, round, and reactive to light. Conjunctivae and EOM are normal ?              Nose: without d/c or deformity ?              Neck: Neck supple. Gross normal ROM ?              Cardiovascular: Normal rate and regular rhythm.   ?              Pulmonary/Chest: Effort normal and breath sounds without rales or wheezing.  ?              Abd:  Soft, NT, ND, + BS, no organomegaly ?              Neurological: Pt is alert. At baseline orientation, motor grossly intact ?              Skin: Skin is warm. No rashes, no other new lesions, LE edema - none ?              Right knee with small effusion, mild reduced ROM, NT ?              Psychiatric: Pt behavior is normal without agitation  ? ?Micro: none ? ?Cardiac tracings I have personally interpreted today:  none ? ?Pertinent Radiological findings (summarize): none  ? ?Lab Results  ?Component Value Date  ? WBC 10.1 12/11/2021  ? HGB 16.1 12/11/2021  ? HCT 47.4 12/11/2021  ? PLT 331.0 12/11/2021  ? GLUCOSE 94 12/11/2021  ? CHOL 175 12/11/2021  ? TRIG 236.0 (H) 12/11/2021  ? HDL 38.50 (L) 12/11/2021  ? LDLDIRECT 122.0 12/11/2021  ? [place] 76 01/02/2021  ? ALT 126 (H) 12/11/2021  ? AST 74 (H) 12/11/2021  ? NA 141 12/11/2021  ? K 4.0 12/11/2021  ? CL 103 12/11/2021  ? CREATININE 0.88  12/11/2021  ? BUN 17 12/11/2021  ? CO2 32 12/11/2021  ? TSH 0.82 12/11/2021  ? PSA 0.16 12/11/2021  ? HGBA1C 5.9 12/11/2021  ? ?Assessment/Plan:  ?Kenneth Atkins is a 55 y.o. White or Caucasian [1] male with  has a past medical history of Allergy, Arthritis, Attention deficit disorder of adult (12/12/2008), GERD (gastroesophageal reflux disease), Hyperlipidemia, Hypertension, and Seizure disorder ([place]). ? ?Vitamin D  deficiency ?Last vitamin D ?Lab Results  ?Component Value Date  ? VD25OH 29.95 (L) 01/02/2021  ? ?Low, to start oral replacement ? ? ?Encounter for well adult exam with abnormal findings ?Age and sex appropriate education and counseling updated with regular exercise and diet ?Referrals for preventative services - none needed ?Immunizations addressed - declines flu shot, covid booster, shingrix ?Smoking counseling  - none needed ?Evidence for depression or other mood disorder - grief and anxiety today, improved with celexa, decliens other change in tx or referrals ?Most recent labs reviewed. ?I have personally reviewed and have noted: ?1) the patient's medical and social history ?2) The patient's current medications and supplements ?3) The patient's height, weight, and BMI have been recorded in the chart ? ? ?Hyperlipidemia ?Marland Kitchen ?Lab Results  ?Component Value Date  ? [place] 76 01/02/2021  ? ?Stable, pt to continue current statin crestor ? ? ?Hyperglycemia ?Lab Results  ?Component Value Date  ? HGBA1C 5.9 12/11/2021  ? ?Stable, pt to continue current medical treatment  - diet ? ? ?Cough ?With recent worsening again, covid neg yesterday, c/w bronchitis vs pna,  for cxr, omnicef course asd, cough med prn,  to f/u any worsening symptoms or concerns ? ?Essential hypertension ?BP Readings from Last 3 Encounters:  ?12/11/21 126/84  ?01/02/21 128/80  ?06/28/20 140/90  ? ?Stable, pt to continue medical treatment lisiinopril ? ? ?Panic anxiety syndrome ?With mild to mod uncontroled recent worsening with grief over brother overdose death, celexa helping greatly per pt, declines need for other referral for counsleing or psychiatry for now ? ?Depression ?No SI or HI, overall stable, continue celexa ? ?Right knee pain ?With recent effusion and pain with more standing recently, for advil otc prn, also f/u sports medicine for possible cortisone ? ?Followup: No follow-ups on file. ? ?Kenneth Cower, MD 12/12/2021 4:21 AM ?Kenneth ?Atkins ?Internal Medicine ?

## 2021-12-11 NOTE — Patient Instructions (Signed)
Please take all new medication as prescribed - the antibiotic, and cough medicine as needed ? ?Please continue all other medications as before, and refills have been done if requested. ? ?Please have the pharmacy call with any other refills you may need. ? ?Please continue your efforts at being more active, low cholesterol diet, and weight control. ? ?You are otherwise up to date with prevention measures today. ? ?Please keep your appointments with your specialists as you may have planned ? ?Please go to the XRAY Department in the first floor for the x-ray testing ? ?Please go to the LAB at the blood drawing area for the tests to be done ? ?You will be contacted by phone if any changes need to be made immediately.  Otherwise, you will receive a letter about your results with an explanation, but please check with MyChart first. ? ?Please remember to sign up for MyChart if you have not done so, as this will be important to you in the future with finding out test results, communicating by private email, and scheduling acute appointments online when needed. ? ?Please make an Appointment to return for your 1 year visit, or sooner if needed ?

## 2021-12-12 NOTE — Assessment & Plan Note (Signed)
No SI or HI, overall stable, continue celexa ?

## 2021-12-12 NOTE — Assessment & Plan Note (Signed)
With mild to mod uncontroled recent worsening with grief over brother overdose death, celexa helping greatly per pt, declines need for other referral for counsleing or psychiatry for now ?

## 2021-12-12 NOTE — Assessment & Plan Note (Signed)
. ?  Lab Results  ?Component Value Date  ? [place] 78 01/02/2021  ? ?Stable, pt to continue current statin crestor ? ?

## 2021-12-12 NOTE — Assessment & Plan Note (Signed)
With recent effusion and pain with more standing recently, for advil otc prn, also f/u sports medicine for possible cortisone ?

## 2021-12-12 NOTE — Assessment & Plan Note (Addendum)
With recent worsening again, covid neg yesterday, c/w bronchitis vs pna,  for cxr, omnicef course asd, cough med prn,  to f/u any worsening symptoms or concerns ?

## 2021-12-12 NOTE — Assessment & Plan Note (Signed)
Lab Results  ?Component Value Date  ? HGBA1C 5.9 12/11/2021  ? ?Stable, pt to continue current medical treatment  - diet ? ?

## 2021-12-12 NOTE — Assessment & Plan Note (Signed)
Age and sex appropriate education and counseling updated with regular exercise and diet ?Referrals for preventative services - none needed ?Immunizations addressed - declines flu shot, covid booster, shingrix ?Smoking counseling  - none needed ?Evidence for depression or other mood disorder - grief and anxiety today, improved with celexa, decliens other change in tx or referrals ?Most recent labs reviewed. ?I have personally reviewed and have noted: ?1) the patient's medical and social history ?2) The patient's current medications and supplements ?3) The patient's height, weight, and BMI have been recorded in the chart ? ?

## 2021-12-12 NOTE — Assessment & Plan Note (Signed)
BP Readings from Last 3 Encounters:  ?12/11/21 126/84  ?01/02/21 128/80  ?06/28/20 140/90  ? ?Stable, pt to continue medical treatment lisiinopril ? ?

## 2021-12-23 ENCOUNTER — Other Ambulatory Visit: Payer: Self-pay | Admitting: Internal Medicine

## 2021-12-23 MED ORDER — METHYLPHENIDATE HCL ER (OSM) 36 MG PO TBCR: 36.0000 mg | EXTENDED_RELEASE_TABLET | Freq: Every day | ORAL | 0 refills | Status: DC

## 2021-12-28 ENCOUNTER — Other Ambulatory Visit: Payer: Self-pay | Admitting: Internal Medicine

## 2021-12-28 NOTE — Telephone Encounter (Signed)
Please refill as per office routine med refill policy (all routine meds to be refilled for 3 mo or monthly (per pt preference) up to one year from last visit, then month to month grace period for 3 mo, then further med refills will have to be denied) ? ?

## 2021-12-29 ENCOUNTER — Other Ambulatory Visit: Payer: Self-pay | Admitting: Internal Medicine

## 2021-12-29 NOTE — Telephone Encounter (Signed)
Please refill as per office routine med refill policy (all routine meds to be refilled for 3 mo or monthly (per pt preference) up to one year from last visit, then month to month grace period for 3 mo, then further med refills will have to be denied) ? ?

## 2022-01-20 ENCOUNTER — Other Ambulatory Visit: Payer: Self-pay | Admitting: Internal Medicine

## 2022-01-20 MED ORDER — METHYLPHENIDATE HCL ER (OSM) 36 MG PO TBCR: 36.0000 mg | EXTENDED_RELEASE_TABLET | Freq: Every day | ORAL | 0 refills | Status: DC

## 2022-02-07 ENCOUNTER — Encounter: Payer: Self-pay | Admitting: Internal Medicine

## 2022-02-07 MED ORDER — ERYTHROMYCIN 5 MG/GM OP OINT: 1.0000 "application " | TOPICAL_OINTMENT | Freq: Three times a day (TID) | OPHTHALMIC | 0 refills | Status: AC

## 2022-02-21 ENCOUNTER — Other Ambulatory Visit: Payer: Self-pay | Admitting: Internal Medicine

## 2022-02-21 MED ORDER — METHYLPHENIDATE HCL ER (OSM) 36 MG PO TBCR: 36.0000 mg | EXTENDED_RELEASE_TABLET | Freq: Every day | ORAL | 0 refills | Status: DC

## 2022-03-24 ENCOUNTER — Other Ambulatory Visit: Payer: Self-pay | Admitting: Internal Medicine

## 2022-03-24 MED ORDER — METHYLPHENIDATE HCL ER (OSM) 36 MG PO TBCR: 36.0000 mg | EXTENDED_RELEASE_TABLET | Freq: Every day | ORAL | 0 refills | Status: DC

## 2022-04-08 ENCOUNTER — Other Ambulatory Visit: Payer: Self-pay | Admitting: Internal Medicine

## 2022-04-23 ENCOUNTER — Other Ambulatory Visit: Payer: Self-pay | Admitting: Internal Medicine

## 2022-04-23 MED ORDER — METHYLPHENIDATE HCL ER (OSM) 36 MG PO TBCR: 36.0000 mg | EXTENDED_RELEASE_TABLET | Freq: Every day | ORAL | 0 refills | Status: DC

## 2022-04-30 DIAGNOSIS — F4321 Adjustment disorder with depressed mood: Secondary | ICD-10-CM | POA: Diagnosis not present

## 2022-05-24 ENCOUNTER — Other Ambulatory Visit: Payer: Self-pay | Admitting: Internal Medicine

## 2022-05-26 DIAGNOSIS — M25562 Pain in left knee: Secondary | ICD-10-CM | POA: Diagnosis not present

## 2022-05-26 DIAGNOSIS — Z9181 History of falling: Secondary | ICD-10-CM | POA: Diagnosis not present

## 2022-05-26 DIAGNOSIS — S8992XA Unspecified injury of left lower leg, initial encounter: Secondary | ICD-10-CM | POA: Diagnosis not present

## 2022-05-26 MED ORDER — METHYLPHENIDATE HCL ER (OSM) 36 MG PO TBCR
36.0000 mg | EXTENDED_RELEASE_TABLET | Freq: Every day | ORAL | 0 refills | Status: DC
Start: 2022-05-26 — End: 2022-06-25

## 2022-06-25 ENCOUNTER — Other Ambulatory Visit: Payer: Self-pay | Admitting: Internal Medicine

## 2022-06-25 MED ORDER — METHYLPHENIDATE HCL ER (OSM) 36 MG PO TBCR
36.0000 mg | EXTENDED_RELEASE_TABLET | Freq: Every day | ORAL | 0 refills | Status: DC
Start: 2022-06-25 — End: 2022-07-23

## 2022-07-23 ENCOUNTER — Other Ambulatory Visit: Payer: Self-pay | Admitting: Internal Medicine

## 2022-07-30 MED ORDER — METHYLPHENIDATE HCL ER (OSM) 36 MG PO TBCR
36.0000 mg | EXTENDED_RELEASE_TABLET | Freq: Every day | ORAL | 0 refills | Status: DC
Start: 2022-07-30 — End: 2022-08-24

## 2022-08-24 ENCOUNTER — Other Ambulatory Visit: Payer: Self-pay | Admitting: Internal Medicine

## 2022-08-25 MED ORDER — METHYLPHENIDATE HCL ER (OSM) 36 MG PO TBCR
36.0000 mg | EXTENDED_RELEASE_TABLET | Freq: Every day | ORAL | 0 refills | Status: DC
Start: 2022-08-25 — End: 2022-09-25

## 2022-09-12 DIAGNOSIS — M19032 Primary osteoarthritis, left wrist: Secondary | ICD-10-CM | POA: Diagnosis not present

## 2022-09-25 ENCOUNTER — Other Ambulatory Visit: Payer: Self-pay | Admitting: Internal Medicine

## 2022-09-25 MED ORDER — METHYLPHENIDATE HCL ER (OSM) 36 MG PO TBCR
36.0000 mg | EXTENDED_RELEASE_TABLET | Freq: Every day | ORAL | 0 refills | Status: DC
Start: 2022-09-25 — End: 2022-10-28

## 2022-10-28 ENCOUNTER — Other Ambulatory Visit: Payer: Self-pay | Admitting: Internal Medicine

## 2022-10-28 MED ORDER — METHYLPHENIDATE HCL ER (OSM) 36 MG PO TBCR
36.0000 mg | EXTENDED_RELEASE_TABLET | Freq: Every day | ORAL | 0 refills | Status: DC
Start: 2022-10-28 — End: 2022-11-25

## 2022-11-25 ENCOUNTER — Other Ambulatory Visit: Payer: Self-pay | Admitting: Internal Medicine

## 2022-11-25 MED ORDER — MELOXICAM 15 MG PO TABS
15.0000 mg | ORAL_TABLET | Freq: Every day | ORAL | 0 refills | Status: DC
Start: 2022-11-25 — End: 2024-03-23

## 2022-11-25 MED ORDER — METHYLPHENIDATE HCL ER (OSM) 36 MG PO TBCR
36.0000 mg | EXTENDED_RELEASE_TABLET | Freq: Every day | ORAL | 0 refills | Status: DC
Start: 2022-11-25 — End: 2022-12-26

## 2022-12-26 ENCOUNTER — Other Ambulatory Visit: Payer: Self-pay | Admitting: Internal Medicine

## 2022-12-29 ENCOUNTER — Other Ambulatory Visit: Payer: Self-pay | Admitting: Internal Medicine

## 2022-12-29 MED ORDER — METHYLPHENIDATE HCL ER (OSM) 36 MG PO TBCR: 36.0000 mg | EXTENDED_RELEASE_TABLET | Freq: Every day | ORAL | 0 refills | Status: DC

## 2022-12-29 NOTE — Telephone Encounter (Signed)
Done erx 

## 2023-01-08 ENCOUNTER — Other Ambulatory Visit: Payer: Self-pay | Admitting: Internal Medicine

## 2023-01-21 ENCOUNTER — Other Ambulatory Visit: Payer: Self-pay | Admitting: Internal Medicine

## 2023-01-22 DIAGNOSIS — M19042 Primary osteoarthritis, left hand: Secondary | ICD-10-CM | POA: Diagnosis not present

## 2023-01-22 DIAGNOSIS — M25532 Pain in left wrist: Secondary | ICD-10-CM | POA: Diagnosis not present

## 2023-01-22 DIAGNOSIS — M19032 Primary osteoarthritis, left wrist: Secondary | ICD-10-CM | POA: Diagnosis not present

## 2023-01-22 DIAGNOSIS — M79644 Pain in right finger(s): Secondary | ICD-10-CM | POA: Diagnosis not present

## 2023-01-22 DIAGNOSIS — M19041 Primary osteoarthritis, right hand: Secondary | ICD-10-CM | POA: Diagnosis not present

## 2023-01-27 ENCOUNTER — Other Ambulatory Visit: Payer: Self-pay | Admitting: Internal Medicine

## 2023-01-27 MED ORDER — METHYLPHENIDATE HCL ER (OSM) 36 MG PO TBCR: 36.0000 mg | EXTENDED_RELEASE_TABLET | Freq: Every day | ORAL | 0 refills | Status: DC

## 2023-01-27 NOTE — Telephone Encounter (Signed)
Done erx 

## 2023-01-30 ENCOUNTER — Other Ambulatory Visit: Payer: Self-pay | Admitting: Internal Medicine

## 2023-02-01 ENCOUNTER — Other Ambulatory Visit: Payer: Self-pay | Admitting: Internal Medicine

## 2023-02-03 ENCOUNTER — Encounter: Payer: Self-pay | Admitting: Internal Medicine

## 2023-02-04 ENCOUNTER — Encounter: Payer: Self-pay | Admitting: Internal Medicine

## 2023-02-04 MED ORDER — METHYLPHENIDATE HCL ER (OSM) 36 MG PO TBCR: 36.0000 mg | EXTENDED_RELEASE_TABLET | Freq: Every day | ORAL | 0 refills | Status: DC

## 2023-02-04 NOTE — Telephone Encounter (Signed)
Ok done erx 

## 2023-02-06 ENCOUNTER — Other Ambulatory Visit: Payer: Self-pay

## 2023-03-02 ENCOUNTER — Other Ambulatory Visit: Payer: Self-pay | Admitting: Internal Medicine

## 2023-03-03 ENCOUNTER — Other Ambulatory Visit: Payer: Self-pay | Admitting: Internal Medicine

## 2023-03-06 ENCOUNTER — Other Ambulatory Visit: Payer: Self-pay | Admitting: Internal Medicine

## 2023-03-10 ENCOUNTER — Other Ambulatory Visit: Payer: Self-pay | Admitting: Internal Medicine

## 2023-03-10 ENCOUNTER — Telehealth: Payer: Self-pay | Admitting: Internal Medicine

## 2023-03-10 NOTE — Telephone Encounter (Signed)
Patient is scheduled for 03/19/2023.

## 2023-03-10 NOTE — Telephone Encounter (Signed)
Prescription Request  03/10/2023  LOV: 12/11/2021  What is the name of the medication or equipment?  rosuvastatin (CRESTOR) 40 MG tablet   methylphenidate (CONCERTA) 36 MG PO CR tablet  lisinopril (ZESTRIL) 20 MG tablet  Have you contacted your pharmacy to request a refill? Yes   Which pharmacy would you like this sent to?  [id number] [place], [place] - [address] [place] [address] Phone: [phone] Fax: [phone]    Patient notified that their request is being sent to the clinical staff for review and that they should receive a response within 2 business days.   Please advise at Mobile [phone] (mobile)     Patient has OV scheduled for 03/19/2023.

## 2023-03-10 NOTE — Telephone Encounter (Signed)
Refills was denied earlier pt has not seen MD since 12/11/21. Pls schedule appt.Marland KitchenRaechel Chute

## 2023-03-13 ENCOUNTER — Other Ambulatory Visit (INDEPENDENT_AMBULATORY_CARE_PROVIDER_SITE_OTHER): Payer: BC Managed Care – PPO

## 2023-03-13 ENCOUNTER — Telehealth (INDEPENDENT_AMBULATORY_CARE_PROVIDER_SITE_OTHER): Payer: BC Managed Care – PPO | Admitting: Nurse Practitioner

## 2023-03-13 ENCOUNTER — Other Ambulatory Visit: Payer: Self-pay | Admitting: Nurse Practitioner

## 2023-03-13 DIAGNOSIS — F32A Depression, unspecified: Secondary | ICD-10-CM | POA: Diagnosis not present

## 2023-03-13 DIAGNOSIS — F988 Other specified behavioral and emotional disorders with onset usually occurring in childhood and adolescence: Secondary | ICD-10-CM

## 2023-03-13 DIAGNOSIS — I10 Essential (primary) hypertension: Secondary | ICD-10-CM | POA: Diagnosis not present

## 2023-03-13 DIAGNOSIS — E7800 Pure hypercholesterolemia, unspecified: Secondary | ICD-10-CM

## 2023-03-13 LAB — COMPREHENSIVE METABOLIC PANEL
ALT: 75 U/L — ABNORMAL HIGH (ref 0–53)
AST: 51 U/L — ABNORMAL HIGH (ref 0–37)
Albumin: 4.7 g/dL (ref 3.5–5.2)
Alkaline Phosphatase: 66 U/L (ref 39–117)
BUN: 12 mg/dL (ref 6–23)
CO2: 27 mEq/L (ref 19–32)
Calcium: 9.5 mg/dL (ref 8.4–10.5)
Chloride: 101 mEq/L (ref 96–112)
Creatinine, Ser: 0.85 mg/dL (ref 0.40–1.50)
GFR: 98.32 mL/min (ref >60.00)
Glucose, Bld: 156 mg/dL — ABNORMAL HIGH (ref 70–99)
Potassium: 4.5 mEq/L (ref 3.5–5.1)
Sodium: 138 mEq/L (ref 135–145)
Total Bilirubin: 0.7 mg/dL (ref 0.2–1.2)
Total Protein: 7.8 g/dL (ref 6.0–8.3)

## 2023-03-13 MED ORDER — METHYLPHENIDATE HCL ER (OSM) 36 MG PO TBCR
36.0000 mg | EXTENDED_RELEASE_TABLET | Freq: Every day | ORAL | 0 refills | Status: DC
Start: 2023-03-13 — End: 2023-04-14

## 2023-03-13 MED ORDER — ROSUVASTATIN CALCIUM 40 MG PO TABS
40.0000 mg | ORAL_TABLET | Freq: Every day | ORAL | 0 refills | Status: DC
Start: 2023-03-13 — End: 2023-06-04

## 2023-03-13 MED ORDER — LISINOPRIL 20 MG PO TABS
20.0000 mg | ORAL_TABLET | Freq: Every day | ORAL | 0 refills | Status: DC
Start: 2023-03-13 — End: 2023-06-04

## 2023-03-13 MED ORDER — CITALOPRAM HYDROBROMIDE 10 MG PO TABS: 10.0000 mg | ORAL_TABLET | Freq: Every day | ORAL | 0 refills | Status: DC

## 2023-03-13 NOTE — Assessment & Plan Note (Signed)
Chronic, Continue rosuvastatin 40 mg daily

## 2023-03-13 NOTE — Progress Notes (Signed)
Established Patient Office Visit  An audio/visual tele-health visit was completed today for this patient. I connected with  Kenneth Atkins on 03/13/23 utilizing audio/visual technology and verified that I am speaking with the correct person using two identifiers. The patient was located at their place of employment, and I was located at the office of [facility] at [facility] during the encounter. I discussed the limitations of evaluation and management by telemedicine. The patient expressed understanding and agreed to proceed.     Subjective   Patient ID: Kenneth Atkins, male    DOB: 11/30/1965  Age: 57 y.o. MRN: [id number]  Chief Complaint  Patient presents with   Medication Refill    Patient arrives today virtually to discuss medication refills. Depression: Reports mood is stable denies suicidal ideation.  Currently on citalopram 10 mg daily requesting refill of this today.  ADD/fatigue: Is on Concerta 36 mg daily, reports this helps him with focus and concentration as well as his energy levels.  He denies any chest pain.  Does note that he had elevated blood pressure last time he saw provider but he is not exactly sure how high it was.  Does not have blood pressure available today.  Does see his primary care in less than 1 week for annual physical exam.  Hyperlipidemia/hypertension: Takes lisinopril 20 mg daily and rosuvastatin 40 mg daily for treatment of hypertension and high cholesterol.  He reports tolerating these medications well.  Last metabolic panel identified stable kidney function with EGFR of 98.3 and creatinine 0.8 and normal potassium level of 4.5.    Review of Systems  Respiratory:  Negative for shortness of breath.   Cardiovascular:  Negative for chest pain.  Psychiatric/Behavioral:  Negative for depression and suicidal ideas. The patient is not nervous/anxious.       Objective:     There were no vitals taken for this visit. BP Readings from Last  3 Encounters:  12/11/21 126/84  01/02/21 128/80  06/28/20 140/90   Wt Readings from Last 3 Encounters:  12/11/21 248 lb (112.5 kg)  01/02/21 233 lb 9.6 oz (106 kg)  06/28/20 236 lb (107 kg)      Physical Exam Comprehensive physical exam not completed today as office visit was conducted remotely.  Patient appears well over video.  Patient was alert and oriented, and appeared to have appropriate judgment.   Results for orders placed or performed in visit on 03/13/23  Comprehensive metabolic panel  Result Value Ref Range   Sodium 138 135 - 145 mEq/L   Potassium 4.5 3.5 - 5.1 mEq/L   Chloride 101 96 - 112 mEq/L   CO2 27 19 - 32 mEq/L   Glucose, Bld 156 (H) 70 - 99 mg/dL   BUN 12 6 - 23 mg/dL   Creatinine, Ser 0.86 0.40 - 1.50 mg/dL   Total Bilirubin 0.7 0.2 - 1.2 mg/dL   Alkaline Phosphatase 66 39 - 117 U/L   AST 51 (H) 0 - 37 U/L   ALT 75 (H) 0 - 53 U/L   Total Protein 7.8 6.0 - 8.3 g/dL   Albumin 4.7 3.5 - 5.2 g/dL   GFR 57.84 >69.62 mL/min   Calcium 9.5 8.4 - 10.5 mg/dL      The 95-MWUX ASCVD risk score (Arnett DK, et al., 2019) is: 9.1%    Assessment & Plan:   Problem List Items Addressed This Visit       Cardiovascular and Mediastinum   Essential hypertension  Chronic, no blood pressure reading available today Continue lisinopril 20 mg daily, follow-up as scheduled next week for close monitoring of blood pressure.       Relevant Medications   lisinopril (ZESTRIL) 20 MG tablet   rosuvastatin (CRESTOR) 40 MG tablet     Other   Attention deficit disorder of adult    Chronic, stable Will send in partial prescription of Concerta 36 mg to be taken once a day by mouth so that patient can have medication available until he sees his primary care next week.      Relevant Medications   methylphenidate (CONCERTA) 36 MG PO CR tablet   Hyperlipidemia - Primary    Chronic, Continue rosuvastatin 40 mg daily      Relevant Medications   lisinopril (ZESTRIL) 20  MG tablet   rosuvastatin (CRESTOR) 40 MG tablet   Depression    Chronic, stable Citalopram 10 mg daily sent to patient's pharmacy      Relevant Medications   citalopram (CELEXA) 10 MG tablet    Return for As scheduled with your primary care provider next week.    Elenore Paddy, NP

## 2023-03-13 NOTE — Assessment & Plan Note (Signed)
Chronic, no blood pressure reading available today Continue lisinopril 20 mg daily, follow-up as scheduled next week for close monitoring of blood pressure.

## 2023-03-13 NOTE — Assessment & Plan Note (Signed)
Chronic, stable Will send in partial prescription of Concerta 36 mg to be taken once a day by mouth so that patient can have medication available until he sees his primary care next week.

## 2023-03-13 NOTE — Assessment & Plan Note (Signed)
Chronic, stable Citalopram 10 mg daily sent to patient's pharmacy

## 2023-03-19 ENCOUNTER — Ambulatory Visit (INDEPENDENT_AMBULATORY_CARE_PROVIDER_SITE_OTHER): Payer: BC Managed Care – PPO | Admitting: Internal Medicine

## 2023-03-19 ENCOUNTER — Other Ambulatory Visit: Payer: Self-pay | Admitting: Internal Medicine

## 2023-03-19 ENCOUNTER — Encounter: Payer: Self-pay | Admitting: Internal Medicine

## 2023-03-19 VITALS — BP 124/82 | HR 87 | Temp 98.5°F | Ht 73.0 in | Wt 234.0 lb

## 2023-03-19 DIAGNOSIS — R7989 Other specified abnormal findings of blood chemistry: Secondary | ICD-10-CM | POA: Diagnosis not present

## 2023-03-19 DIAGNOSIS — R739 Hyperglycemia, unspecified: Secondary | ICD-10-CM | POA: Diagnosis not present

## 2023-03-19 DIAGNOSIS — E559 Vitamin D deficiency, unspecified: Secondary | ICD-10-CM

## 2023-03-19 DIAGNOSIS — Z0000 Encounter for general adult medical examination without abnormal findings: Secondary | ICD-10-CM

## 2023-03-19 DIAGNOSIS — I10 Essential (primary) hypertension: Secondary | ICD-10-CM

## 2023-03-19 DIAGNOSIS — E7800 Pure hypercholesterolemia, unspecified: Secondary | ICD-10-CM

## 2023-03-19 DIAGNOSIS — E538 Deficiency of other specified B group vitamins: Secondary | ICD-10-CM

## 2023-03-19 DIAGNOSIS — Z0001 Encounter for general adult medical examination with abnormal findings: Secondary | ICD-10-CM

## 2023-03-19 DIAGNOSIS — R21 Rash and other nonspecific skin eruption: Secondary | ICD-10-CM | POA: Diagnosis not present

## 2023-03-19 DIAGNOSIS — Z125 Encounter for screening for malignant neoplasm of prostate: Secondary | ICD-10-CM | POA: Diagnosis not present

## 2023-03-19 DIAGNOSIS — R748 Abnormal levels of other serum enzymes: Secondary | ICD-10-CM

## 2023-03-19 LAB — URINALYSIS, ROUTINE W REFLEX MICROSCOPIC
Bilirubin Urine: NEGATIVE
Hgb urine dipstick: NEGATIVE
Ketones, ur: NEGATIVE
Leukocytes,Ua: NEGATIVE
Nitrite: NEGATIVE
RBC / HPF: NONE SEEN (ref 0–?)
Specific Gravity, Urine: 1.025 (ref 1.000–1.030)
Total Protein, Urine: NEGATIVE
Urine Glucose: NEGATIVE
Urobilinogen, UA: 0.2 (ref 0.0–1.0)
pH: 6 (ref 5.0–8.0)

## 2023-03-19 LAB — LIPID PANEL
Cholesterol: 164 mg/dL (ref 0–200)
HDL: 41.8 mg/dL (ref >39.00)
LDL Cholesterol: 96 mg/dL (ref 0–99)
NonHDL: 122.42
Total CHOL/HDL Ratio: 4
Triglycerides: 132 mg/dL (ref 0.0–149.0)
VLDL: 26.4 mg/dL (ref 0.0–40.0)

## 2023-03-19 LAB — HEPATIC FUNCTION PANEL
ALT: 88 U/L — ABNORMAL HIGH (ref 0–53)
AST: 73 U/L — ABNORMAL HIGH (ref 0–37)
Albumin: 4.9 g/dL (ref 3.5–5.2)
Alkaline Phosphatase: 65 U/L (ref 39–117)
Bilirubin, Direct: 0.2 mg/dL (ref 0.0–0.3)
Total Bilirubin: 0.9 mg/dL (ref 0.2–1.2)
Total Protein: 7.8 g/dL (ref 6.0–8.3)

## 2023-03-19 LAB — CBC WITH DIFFERENTIAL/PLATELET
Basophils Absolute: 0.1 10*3/uL (ref 0.0–0.1)
Basophils Relative: 1 % (ref 0.0–3.0)
Eosinophils Absolute: 0.2 10*3/uL (ref 0.0–0.7)
Eosinophils Relative: 2.2 % (ref 0.0–5.0)
HCT: 47.6 % (ref 39.0–52.0)
Hemoglobin: 16 g/dL (ref 13.0–17.0)
Lymphocytes Relative: 19.1 % (ref 12.0–46.0)
Lymphs Abs: 1.5 10*3/uL (ref 0.7–4.0)
MCHC: 33.6 g/dL (ref 30.0–36.0)
MCV: 90.6 fl (ref 78.0–100.0)
Monocytes Absolute: 0.6 10*3/uL (ref 0.1–1.0)
Monocytes Relative: 7.4 % (ref 3.0–12.0)
Neutro Abs: 5.6 10*3/uL (ref 1.4–7.7)
Neutrophils Relative %: 70.3 % (ref 43.0–77.0)
Platelets: 237 10*3/uL (ref 150.0–400.0)
RBC: 5.26 Mil/uL (ref 4.22–5.81)
RDW: 13 % (ref 11.5–15.5)
WBC: 8 10*3/uL (ref 4.0–10.5)

## 2023-03-19 LAB — BASIC METABOLIC PANEL
BUN: 12 mg/dL (ref 6–23)
CO2: 27 mEq/L (ref 19–32)
Calcium: 10 mg/dL (ref 8.4–10.5)
Chloride: 103 mEq/L (ref 96–112)
Creatinine, Ser: 0.81 mg/dL (ref 0.40–1.50)
GFR: 99.75 mL/min (ref >60.00)
Glucose, Bld: 99 mg/dL (ref 70–99)
Potassium: 4.8 mEq/L (ref 3.5–5.1)
Sodium: 141 mEq/L (ref 135–145)

## 2023-03-19 LAB — HEMOGLOBIN A1C: Hgb A1c MFr Bld: 5.4 % (ref 4.6–6.5)

## 2023-03-19 LAB — TSH: TSH: 0.86 u[IU]/mL (ref 0.35–5.50)

## 2023-03-19 LAB — VITAMIN D 25 HYDROXY (VIT D DEFICIENCY, FRACTURES): VITD: 24.33 ng/mL — ABNORMAL LOW (ref 30.00–100.00)

## 2023-03-19 LAB — VITAMIN B12: Vitamin B-12: 518 pg/mL (ref 211–911)

## 2023-03-19 LAB — PSA: PSA: 0.55 ng/mL (ref 0.10–4.00)

## 2023-03-19 MED ORDER — FLUCONAZOLE 100 MG PO TABS
100.0000 mg | ORAL_TABLET | Freq: Every day | ORAL | 0 refills | Status: DC
Start: 2023-03-19 — End: 2024-01-18

## 2023-03-19 NOTE — Patient Instructions (Addendum)
Please have your Shingrix (shingles) shots done at your local pharmacy.  Please take all new medication as prescribed - the pills for fungal rash  Please continue all other medications as before, and refills have been done if requested.  Please have the pharmacy call with any other refills you may need.  Please continue your efforts at being more active, low cholesterol diet, and weight control.  You are otherwise up to date with prevention measures today.  Please keep your appointments with your specialists as you may have planned  Please go to the LAB at the blood drawing area for the tests to be done  You will be contacted by phone if any changes need to be made immediately.  Otherwise, you will receive a letter about your results with an explanation, but please check with MyChart first.  Please remember to sign up for MyChart if you have not done so, as this will be important to you in the future with finding out test results, communicating by private email, and scheduling acute appointments online when needed.  Please make an Appointment to return for your 1 year visit, or sooner if needed

## 2023-03-19 NOTE — Progress Notes (Signed)
Patient ID: Kenneth Atkins, male   DOB: [birth year], 57 y.o.   MRN: [id number]         Chief Complaint:: wellness exam and Annual Exam (Has a rash on lower abdomen , been getting spots on back and chest for several years )  , htn, hld, hyperglycemia, low vit d, abnormal LFTs       HPI:  Kenneth Atkins is a 57 y.o. male here for wellness exam; for shingrix at pharmacy, declines covid booster, o/w up to date                Also Pt denies chest pain, increased sob or doe, wheezing, orthopnea, PND, increased LE swelling, palpitations, dizziness or syncope.   Pt denies polydipsia, polyuria, or new focal neuro s/s.    Pt denies fever, wt loss, night sweats, loss of appetite, or other constitutional symptoms  Denies worsening reflux, abd pain, dysphagia, n/v, bowel change or blood.  Has rather large spreading nontender erythem rash startin bilateral groin area now up to mid abd, and multiple spots to the chest area in the past few wks as well.  Drinks ETOH regularly but states he can easily quit.     Wt Readings from Last 3 Encounters:  03/19/23 234 lb (106.1 kg)  12/11/21 248 lb (112.5 kg)  01/02/21 233 lb 9.6 oz (106 kg)   BP Readings from Last 3 Encounters:  03/19/23 124/82  12/11/21 126/84  01/02/21 128/80   Immunization History  Administered Date(s) Administered   Influenza Inj Mdck Quad Pf 11/30/2018   Influenza,inj,Quad PF,6+ Mos 07/07/2013, 07/08/2019, 06/28/2020   Influenza-Unspecified 12/01/2018   PFIZER(Purple Top)SARS-COV-2 Vaccination 11/30/2019, 12/28/2019   Tdap 12/31/2016   Health Maintenance Due  Topic Date Due   Zoster Vaccines- Shingrix (1 of 2) Never done   COVID-19 Vaccine (3 - 2023-24 season) 05/30/2022      Past Medical History:  Diagnosis Date   Allergy    Arthritis    Attention deficit disorder of adult 12/12/2008   Qualifier: Diagnosis of  By: Jonny Ruiz MD, Len Blalock    GERD (gastroesophageal reflux disease)    Hyperlipidemia    Hypertension    Seizure disorder  (HCC)    None since 2001- pt states none since 2001- off Depakote    Past Surgical History:  Procedure Laterality Date   CHOLECYSTECTOMY      reports that he quit smoking about 6 years ago. He smoked an average of .5 packs per day. He has never used smokeless tobacco. He reports current alcohol use of about 12.0 standard drinks of alcohol per week. He reports that he does not use drugs. family history includes Alcohol abuse in his father; Dementia in his mother; Hypertension in his brother and father. No Known Allergies Current Outpatient Medications on File Prior to Visit  Medication Sig Dispense Refill   Cholecalciferol (THERA-D 2000) 50 MCG (2000 UT) TABS 1 tab by mouth once daily 30 tablet 99   citalopram (CELEXA) 10 MG tablet Take 1 tablet (10 mg total) by mouth daily. 90 tablet 0   ketoconazole (NIZORAL) 2 % cream Apply 1 application topically daily. 30 g 1   lisinopril (ZESTRIL) 20 MG tablet Take 1 tablet (20 mg total) by mouth daily. 90 tablet 0   meloxicam (MOBIC) 15 MG tablet Take 1 tablet (15 mg total) by mouth daily. **PT NEEDS APPOINTMENT FOR ADDITIONAL REFILLS** 30 tablet 0   methylphenidate (CONCERTA) 36 MG PO CR tablet Take 1 tablet (36  mg total) by mouth daily. 7 tablet 0   rosuvastatin (CRESTOR) 40 MG tablet Take 1 tablet (40 mg total) by mouth daily. 90 tablet 0   triamcinolone (NASACORT AQ) 55 MCG/ACT AERO nasal inhaler Place 2 sprays into the nose daily. 1 Inhaler 12   No current facility-administered medications on file prior to visit.        ROS:  All others reviewed and negative.  Objective        PE:  BP 124/82 (BP Location: Left Arm, Patient Position: Sitting, Cuff Size: Normal)   Pulse 87   Temp 98.5 F (36.9 C) (Oral)   Ht 6\' 1"  (1.854 m)   Wt 234 lb (106.1 kg)   SpO2 96%   BMI 30.87 kg/m                 Constitutional: Pt appears in NAD               HENT: Head: NCAT.                Right Ear: External ear normal.                 Left Ear: External  ear normal.                Eyes: . Pupils are equal, round, and reactive to light. Conjunctivae and EOM are normal               Nose: without d/c or deformity               Neck: Neck supple. Gross normal ROM               Cardiovascular: Normal rate and regular rhythm.                 Pulmonary/Chest: Effort normal and breath sounds without rales or wheezing.                Abd:  Soft, NT, ND, + BS, no organomegaly               Neurological: Pt is alert. At baseline orientation, motor grossly intact               Skin: Skin is warm. LE edema - none, bilat groin and lower abd nontender large erythem rash               Psychiatric: Pt behavior is normal without agitation   Micro: none  Cardiac tracings I have personally interpreted today:  none  Pertinent Radiological findings (summarize): none   Lab Results  Component Value Date   WBC 8.0 03/19/2023   HGB 16.0 03/19/2023   HCT 47.6 03/19/2023   PLT 237.0 03/19/2023   GLUCOSE 99 03/19/2023   CHOL 164 03/19/2023   TRIG 132.0 03/19/2023   HDL 41.80 03/19/2023   LDLDIRECT 122.0 12/11/2021   LDLCALC 96 03/19/2023   ALT 88 (H) 03/19/2023   AST 73 (H) 03/19/2023   NA 141 03/19/2023   K 4.8 03/19/2023   CL 103 03/19/2023   CREATININE 0.81 03/19/2023   BUN 12 03/19/2023   CO2 27 03/19/2023   TSH 0.86 03/19/2023   PSA 0.55 03/19/2023   HGBA1C 5.4 03/19/2023   Assessment/Plan:  Kenneth Atkins is a 57 y.o. White or Caucasian [1] male with  has a past medical history of Allergy, Arthritis, Attention deficit disorder of adult (12/12/2008), GERD (gastroesophageal reflux disease), Hyperlipidemia, Hypertension, and Seizure  disorder (HCC).  Encounter for well adult exam with abnormal findings Age and sex appropriate education and counseling updated with regular exercise and diet Referrals for preventative services - none needed Immunizations addressed - for shingrix at pharmacy, declines covid booster Smoking counseling  - none  needed Evidence for depression or other mood disorder - none significant Most recent labs reviewed. I have personally reviewed and have noted: 1) the patient's medical and social history 2) The patient's current medications and supplements 3) The patient's height, weight, and BMI have been recorded in the chart   Essential hypertension BP Readings from Last 3 Encounters:  03/19/23 124/82  12/11/21 126/84  01/02/21 128/80   Stable, pt to continue medical treatment lisinopril 20 mg qd   Hyperlipidemia Lab Results  Component Value Date   LDLCALC 96 03/19/2023   Stable, pt to continue current statin crestor 40 qd   Rash Marked fungal, for diflucan 100 every day x 7 days  Hyperglycemia Lab Results  Component Value Date   HGBA1C 5.4 03/19/2023   Stable, pt to continue current medical treatment  - diet, wt control   Vitamin D deficiency Last vitamin D Lab Results  Component Value Date   VD25OH 24.33 (L) 03/19/2023   Low to start oral replacement   Abnormal LFTs Mild, ? Etoh vs fatty liver vs other, for f/u lab  Followup: Return in about 1 year (around 03/18/2024).  Oliver Barre, MD 03/21/2023 8:00 PM [facility]

## 2023-03-20 LAB — HEPATITIS PANEL, ACUTE
Hep A IgM: NONREACTIVE
Hep B C IgM: NONREACTIVE
Hepatitis B Surface Ag: NONREACTIVE
Hepatitis C Ab: NONREACTIVE

## 2023-03-21 ENCOUNTER — Encounter: Payer: Self-pay | Admitting: Internal Medicine

## 2023-03-21 NOTE — Addendum Note (Signed)
Addended by: Corwin Levins on: 03/21/2023 08:01 PM   Modules accepted: Level of Service

## 2023-03-21 NOTE — Assessment & Plan Note (Signed)
Marked fungal, for diflucan 100 every day x 7 days

## 2023-03-21 NOTE — Assessment & Plan Note (Signed)
Mild, ? Etoh vs fatty liver vs other, for f/u lab

## 2023-03-21 NOTE — Assessment & Plan Note (Signed)
BP Readings from Last 3 Encounters:  03/19/23 124/82  12/11/21 126/84  01/02/21 128/80   Stable, pt to continue medical treatment lisinopril 20 mg qd

## 2023-03-21 NOTE — Assessment & Plan Note (Signed)
Age and sex appropriate education and counseling updated with regular exercise and diet Referrals for preventative services - none needed Immunizations addressed - for shingrix at pharmacy, declines covid booster Smoking counseling  - none needed Evidence for depression or other mood disorder - none significant Most recent labs reviewed. I have personally reviewed and have noted: 1) the patient's medical and social history 2) The patient's current medications and supplements 3) The patient's height, weight, and BMI have been recorded in the chart  

## 2023-03-21 NOTE — Assessment & Plan Note (Signed)
Lab Results  Component Value Date   LDLCALC 96 03/19/2023   Stable, pt to continue current statin crestor 40 qd

## 2023-03-21 NOTE — Assessment & Plan Note (Signed)
Last vitamin D Lab Results  Component Value Date   VD25OH 24.33 (L) 03/19/2023   Low to start oral replacement

## 2023-03-21 NOTE — Assessment & Plan Note (Signed)
Lab Results  Component Value Date   HGBA1C 5.4 03/19/2023   Stable, pt to continue current medical treatment  - diet, wt control

## 2023-03-24 DIAGNOSIS — M19032 Primary osteoarthritis, left wrist: Secondary | ICD-10-CM | POA: Diagnosis not present

## 2023-04-01 ENCOUNTER — Encounter: Payer: Self-pay | Admitting: Internal Medicine

## 2023-04-01 DIAGNOSIS — F410 Panic disorder [episodic paroxysmal anxiety] without agoraphobia: Secondary | ICD-10-CM

## 2023-04-01 DIAGNOSIS — F988 Other specified behavioral and emotional disorders with onset usually occurring in childhood and adolescence: Secondary | ICD-10-CM

## 2023-04-01 DIAGNOSIS — F32A Depression, unspecified: Secondary | ICD-10-CM

## 2023-04-01 DIAGNOSIS — F4321 Adjustment disorder with depressed mood: Secondary | ICD-10-CM

## 2023-04-14 ENCOUNTER — Other Ambulatory Visit: Payer: Self-pay | Admitting: Nurse Practitioner

## 2023-04-14 DIAGNOSIS — F988 Other specified behavioral and emotional disorders with onset usually occurring in childhood and adolescence: Secondary | ICD-10-CM

## 2023-04-14 MED ORDER — METHYLPHENIDATE HCL ER (OSM) 36 MG PO TBCR: 36.0000 mg | EXTENDED_RELEASE_TABLET | Freq: Every day | ORAL | 0 refills | Status: DC

## 2023-05-15 ENCOUNTER — Other Ambulatory Visit: Payer: Self-pay | Admitting: Internal Medicine

## 2023-05-15 DIAGNOSIS — F988 Other specified behavioral and emotional disorders with onset usually occurring in childhood and adolescence: Secondary | ICD-10-CM

## 2023-05-15 MED ORDER — METHYLPHENIDATE HCL ER (OSM) 36 MG PO TBCR: 36.0000 mg | EXTENDED_RELEASE_TABLET | Freq: Every day | ORAL | 0 refills | Status: DC

## 2023-06-04 ENCOUNTER — Other Ambulatory Visit: Payer: Self-pay | Admitting: Nurse Practitioner

## 2023-06-04 DIAGNOSIS — F32A Depression, unspecified: Secondary | ICD-10-CM

## 2023-06-04 DIAGNOSIS — E7800 Pure hypercholesterolemia, unspecified: Secondary | ICD-10-CM

## 2023-06-04 DIAGNOSIS — I10 Essential (primary) hypertension: Secondary | ICD-10-CM

## 2023-06-17 ENCOUNTER — Other Ambulatory Visit: Payer: Self-pay | Admitting: Internal Medicine

## 2023-06-17 DIAGNOSIS — F988 Other specified behavioral and emotional disorders with onset usually occurring in childhood and adolescence: Secondary | ICD-10-CM

## 2023-06-17 MED ORDER — METHYLPHENIDATE HCL ER (OSM) 36 MG PO TBCR
36.0000 mg | EXTENDED_RELEASE_TABLET | Freq: Every day | ORAL | 0 refills | Status: DC
Start: 2023-06-17 — End: 2023-07-16

## 2023-07-13 IMAGING — DX DG CHEST 2V
2 series · 2 of 2 positions shown · non-contrast
Comparison: None.

CLINICAL DATA: Patient with persistent cough for 2 weeks.

EXAM:
CHEST - 2 VIEW

[chest pa]
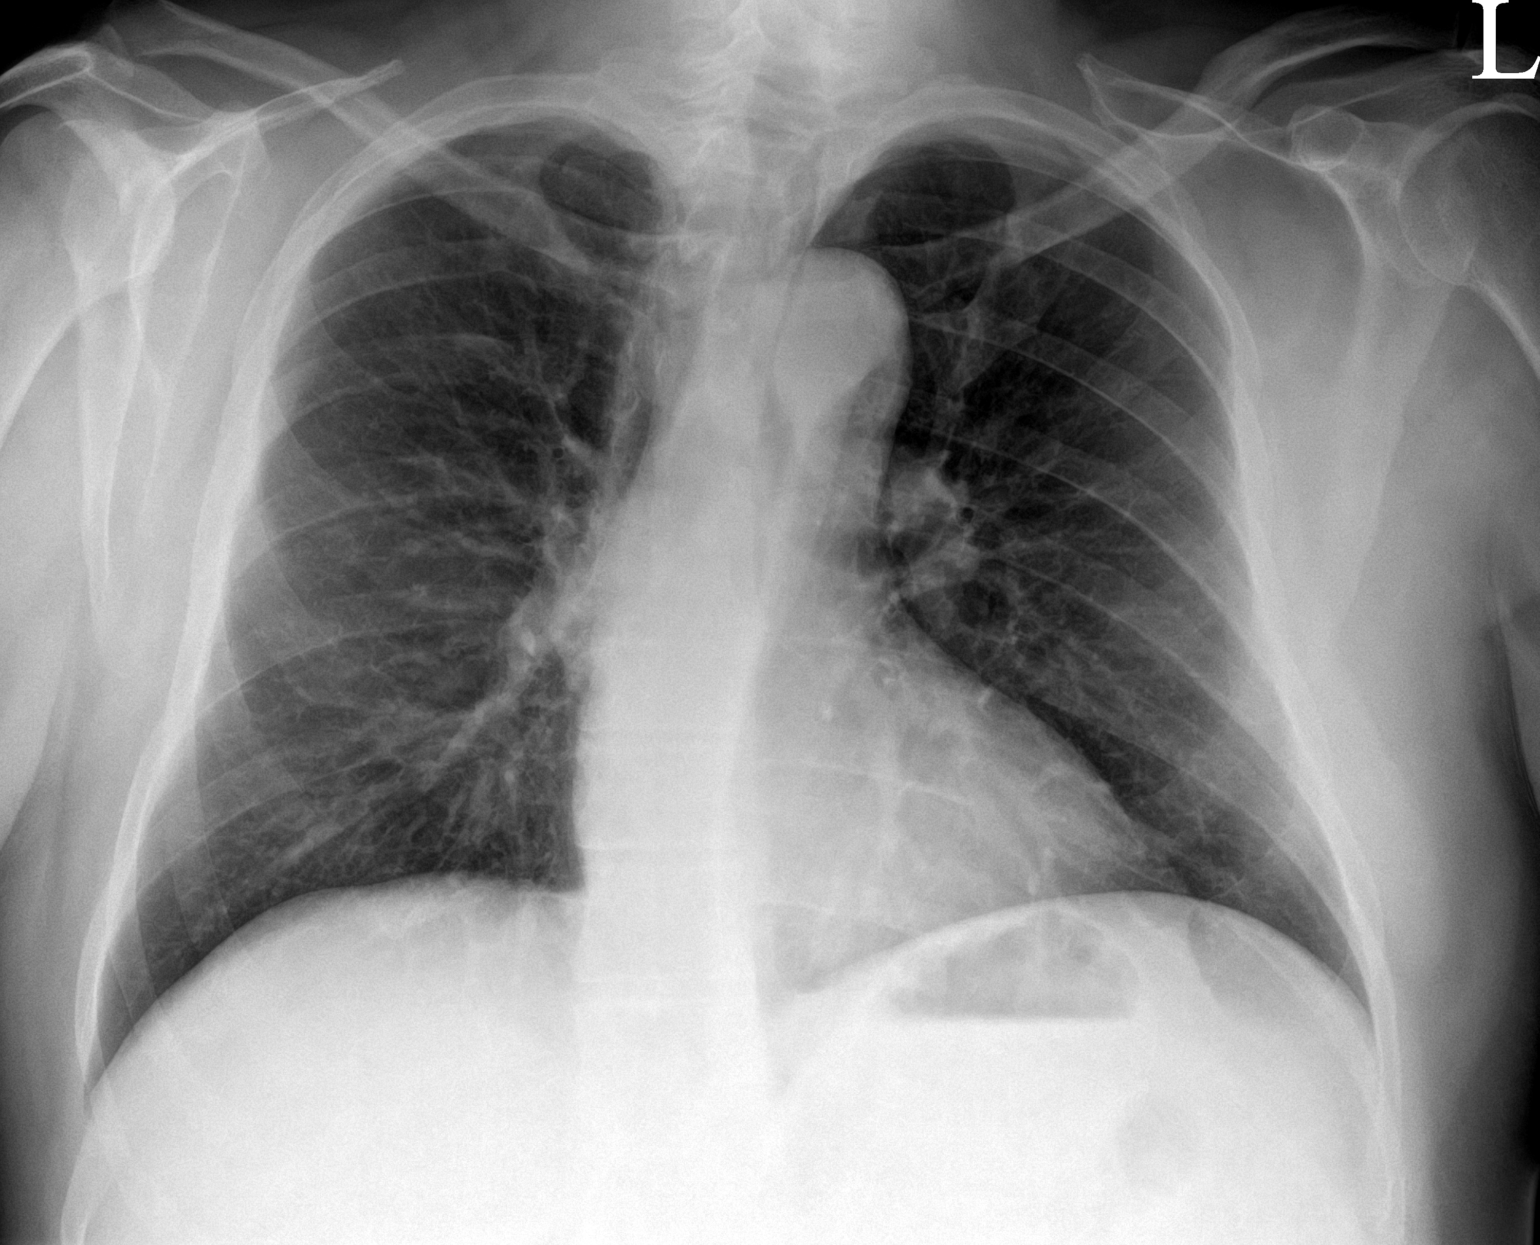

[chest lat]
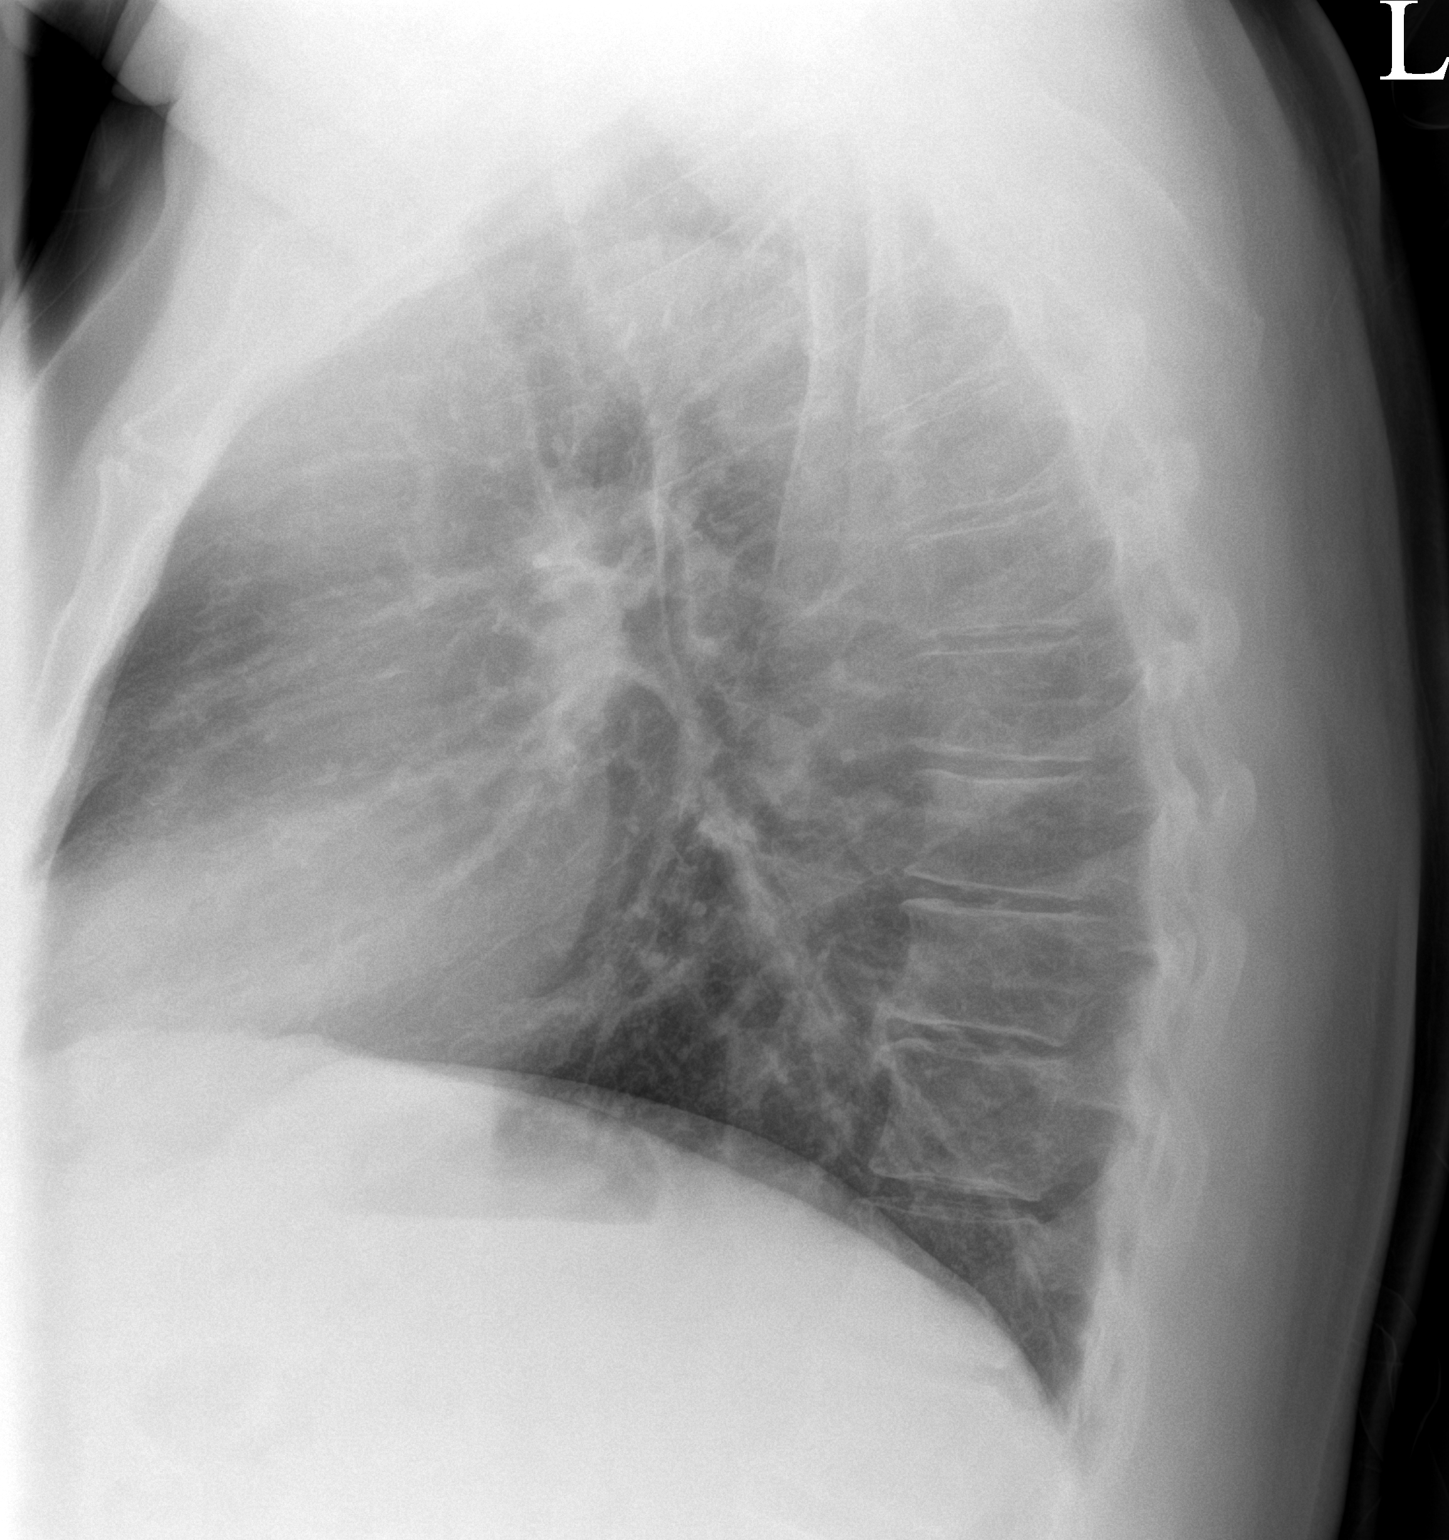

[2 of 2 positions shown; findings below may reference images not displayed]

FINDINGS: The heart size and mediastinal contours are within normal limits.
Both lungs are clear. The visualized skeletal structures are
unremarkable.
IMPRESSION: No active cardiopulmonary disease.

## 2023-07-16 ENCOUNTER — Other Ambulatory Visit: Payer: Self-pay | Admitting: Internal Medicine

## 2023-07-16 DIAGNOSIS — F988 Other specified behavioral and emotional disorders with onset usually occurring in childhood and adolescence: Secondary | ICD-10-CM

## 2023-07-17 MED ORDER — METHYLPHENIDATE HCL ER (OSM) 36 MG PO TBCR
36.0000 mg | EXTENDED_RELEASE_TABLET | Freq: Every day | ORAL | 0 refills | Status: DC
Start: 2023-07-17 — End: 2023-08-14

## 2023-08-05 ENCOUNTER — Encounter: Payer: Self-pay | Admitting: Gastroenterology

## 2023-08-14 ENCOUNTER — Other Ambulatory Visit: Payer: Self-pay | Admitting: Internal Medicine

## 2023-08-14 DIAGNOSIS — F988 Other specified behavioral and emotional disorders with onset usually occurring in childhood and adolescence: Secondary | ICD-10-CM

## 2023-08-14 MED ORDER — METHYLPHENIDATE HCL ER (OSM) 36 MG PO TBCR
36.0000 mg | EXTENDED_RELEASE_TABLET | Freq: Every day | ORAL | 0 refills | Status: DC
Start: 2023-08-14 — End: 2023-09-15

## 2023-09-05 ENCOUNTER — Other Ambulatory Visit: Payer: Self-pay | Admitting: Nurse Practitioner

## 2023-09-05 DIAGNOSIS — I10 Essential (primary) hypertension: Secondary | ICD-10-CM

## 2023-09-05 DIAGNOSIS — F32A Depression, unspecified: Secondary | ICD-10-CM

## 2023-09-05 DIAGNOSIS — E7800 Pure hypercholesterolemia, unspecified: Secondary | ICD-10-CM

## 2023-09-15 ENCOUNTER — Other Ambulatory Visit: Payer: Self-pay | Admitting: Internal Medicine

## 2023-09-15 DIAGNOSIS — F988 Other specified behavioral and emotional disorders with onset usually occurring in childhood and adolescence: Secondary | ICD-10-CM

## 2023-09-15 MED ORDER — METHYLPHENIDATE HCL ER (OSM) 36 MG PO TBCR
36.0000 mg | EXTENDED_RELEASE_TABLET | Freq: Every day | ORAL | 0 refills | Status: DC
Start: 2023-09-15 — End: 2023-10-20

## 2023-09-30 ENCOUNTER — Encounter: Payer: Self-pay | Admitting: Internal Medicine

## 2023-09-30 DIAGNOSIS — E7800 Pure hypercholesterolemia, unspecified: Secondary | ICD-10-CM

## 2023-09-30 DIAGNOSIS — R069 Unspecified abnormalities of breathing: Secondary | ICD-10-CM

## 2023-10-01 MED ORDER — ROSUVASTATIN CALCIUM 40 MG PO TABS
40.0000 mg | ORAL_TABLET | Freq: Every day | ORAL | 2 refills | Status: DC
Start: 2023-10-01 — End: 2024-03-23

## 2023-10-20 ENCOUNTER — Other Ambulatory Visit: Payer: Self-pay | Admitting: Internal Medicine

## 2023-10-20 DIAGNOSIS — F988 Other specified behavioral and emotional disorders with onset usually occurring in childhood and adolescence: Secondary | ICD-10-CM

## 2023-10-20 MED ORDER — METHYLPHENIDATE HCL ER (OSM) 36 MG PO TBCR
36.0000 mg | EXTENDED_RELEASE_TABLET | Freq: Every day | ORAL | 0 refills | Status: DC
Start: 2023-10-20 — End: 2023-11-18

## 2023-11-18 ENCOUNTER — Other Ambulatory Visit: Payer: Self-pay | Admitting: Internal Medicine

## 2023-11-18 DIAGNOSIS — F988 Other specified behavioral and emotional disorders with onset usually occurring in childhood and adolescence: Secondary | ICD-10-CM

## 2023-11-19 MED ORDER — METHYLPHENIDATE HCL ER (OSM) 36 MG PO TBCR
36.0000 mg | EXTENDED_RELEASE_TABLET | Freq: Every day | ORAL | 0 refills | Status: DC
Start: 2023-11-19 — End: 2023-12-17

## 2023-12-17 ENCOUNTER — Other Ambulatory Visit: Payer: Self-pay | Admitting: Internal Medicine

## 2023-12-17 DIAGNOSIS — F988 Other specified behavioral and emotional disorders with onset usually occurring in childhood and adolescence: Secondary | ICD-10-CM

## 2023-12-18 MED ORDER — METHYLPHENIDATE HCL ER (OSM) 36 MG PO TBCR: 36.0000 mg | EXTENDED_RELEASE_TABLET | Freq: Every day | ORAL | 0 refills | Status: DC

## 2024-01-15 ENCOUNTER — Other Ambulatory Visit: Payer: Self-pay | Admitting: Internal Medicine

## 2024-01-15 DIAGNOSIS — F988 Other specified behavioral and emotional disorders with onset usually occurring in childhood and adolescence: Secondary | ICD-10-CM

## 2024-01-18 ENCOUNTER — Ambulatory Visit: Payer: BC Managed Care – PPO | Admitting: Internal Medicine

## 2024-01-18 ENCOUNTER — Encounter: Payer: Self-pay | Admitting: Internal Medicine

## 2024-01-18 ENCOUNTER — Other Ambulatory Visit: Payer: Self-pay | Admitting: Internal Medicine

## 2024-01-18 VITALS — BP 132/86 | HR 69 | Ht 72.0 in | Wt 240.0 lb

## 2024-01-18 DIAGNOSIS — G4733 Obstructive sleep apnea (adult) (pediatric): Secondary | ICD-10-CM

## 2024-01-18 DIAGNOSIS — R0683 Snoring: Secondary | ICD-10-CM

## 2024-01-18 DIAGNOSIS — G4710 Hypersomnia, unspecified: Secondary | ICD-10-CM | POA: Diagnosis not present

## 2024-01-18 DIAGNOSIS — F988 Other specified behavioral and emotional disorders with onset usually occurring in childhood and adolescence: Secondary | ICD-10-CM

## 2024-01-18 MED ORDER — METHYLPHENIDATE HCL ER (OSM) 36 MG PO TBCR: 36.0000 mg | EXTENDED_RELEASE_TABLET | Freq: Every day | ORAL | 0 refills | Status: DC

## 2024-01-18 NOTE — Progress Notes (Signed)
 Kenneth Atkins    [id number]    1966-06-23  Primary Care Physician:John, Alveda Aures, MD  Referring Physician: Roslyn Coombe, MD [address] [place],  [place] [address] Reason for Consultation: sleep apnea Date of Consultation: 01/18/2024  Chief complaint:   Chief Complaint  Patient presents with   Pulmonary Consult    Referred by Dr Rosalia Colonel.  He has been concerned about possible OSA alerts on his smart watch that started Sept 2024. He has gained about 15 lbs since Dec 2024.  He has some daytime sleepiness. Epworth=9. He has been told by family that he snores loudly.      HPI: Discussed the use of AI scribe software for clinical note transcription with the patient, who gave verbal consent to proceed.  History of Present Illness Kenneth Atkins "Kenneth Atkins" is a 58 year old male who presents with concerns about sleep apnea. He was referred by PCP for evaluation of sleep apnea.  He has been experiencing symptoms of sleep apnea since approximately September of the previous year. His smart watch indicated irregular breathing patterns at night, and his wife has noted incredibly loud snoring. He experiences daytime sleepiness.  He has a history of high blood pressure for which he is currently taking medication. He also has a past medical history of seizure disorder, with his last seizure occurring in 2000. He was previously on Depakote but has since discontinued it without recurrence of seizures.  He reports significant weight gain over the past six months, which he attributes to emotional eating following the death of his mother last year. He works late hours as a Sports administrator, which may contribute to his irregular eating habits.  Family history is notable for a brother who had sleep apnea but has since passed away. Social history includes a past smoking habit, having quit in 2018 after smoking a pack a day at his heaviest. He currently vapes lightly. He has no history of  breathing issues such as pneumonia or bronchitis.        01/18/2024    3:00 PM  Results of the Epworth flowsheet  Sitting and reading 2  Watching TV 2  Sitting, inactive in a public place (e.g. a theatre or a meeting) 0  As a passenger in a car for an hour without a break 0  Lying down to rest in the afternoon when circumstances permit 3  Sitting and talking to someone 0  Sitting quietly after a lunch without alcohol 2  In a car, while stopped for a few minutes in traffic 0  Total score 9   Social History   Occupational History   Not on file  Tobacco Use   Smoking status: Former    Current packs/day: 0.00    Average packs/day: 1 pack/day for 28.0 years (28.0 ttl pk-yrs)    Types: Cigarettes    Start date: 33    Quit date: 2018    Years since quitting: 7.3   Smokeless tobacco: Never   Tobacco comments:    STATES HE QUIT SMOKING A YEAR AGO  Vaping Use   Vaping status: Every Day  Substance and Sexual Activity   Alcohol use: Yes    Alcohol/week: 12.0 standard drinks of alcohol    Types: 12 Cans of beer per week   Drug use: No   Sexual activity: Not on file    Relevant family history:  Family History  Problem Relation Age of  Onset   Dementia Mother    Hypertension Father    Alcohol abuse Father    Hypertension Brother    Sleep apnea Maternal Grandmother    Colon cancer Neg Hx    Colon polyps Neg Hx    Esophageal cancer Neg Hx    Rectal cancer Neg Hx    Stomach cancer Neg Hx     Past Medical History:  Diagnosis Date   Allergy    Arthritis    Attention deficit disorder of adult 12/12/2008   Qualifier: Diagnosis of  By: Autry Legions MD, Alveda Aures    GERD (gastroesophageal reflux disease)    Hyperlipidemia    Hypertension    Seizure disorder (HCC)    None since 2001- pt states none since 2001- off Depakote     Past Surgical History:  Procedure Laterality Date   CHOLECYSTECTOMY       Physical Exam: Blood pressure 132/86, pulse 69, height 6' (1.829 m),  weight 240 lb (108.9 kg), SpO2 98%. Gen:      No acute distress, fatigued ENT:  mallampati IV, cobblestoning, no nasal polyps, mucus membranes moist Lungs:    No increased respiratory effort, symmetric chest wall excursion, clear to auscultation bilaterally, no wheezes or crackles CV:         Regular rate and rhythm; no murmurs, rubs, or gallops.  No pedal edema Abd:      + bowel sounds; soft, non-tender; no distension MSK: no acute synovitis of DIP or PIP joints, no mechanics hands.  Skin:      Warm and dry; no rashes Neuro: normal speech, no focal facial asymmetry Psych: alert and oriented x3, normal mood and affect   Data Reviewed/Medical Decision Making:  Independent interpretation of tests: Imaging:  Review of patient's chest xray 2023 images revealed no acute pulmonary process. The patient's images have been independently reviewed by me.    PFTs: I have personally reviewed the patient's PFTs and      No data to display          Labs:  Lab Results  Component Value Date   NA 141 03/19/2023   K 4.8 03/19/2023   CO2 27 03/19/2023   GLUCOSE 99 03/19/2023   BUN 12 03/19/2023   CREATININE 0.81 03/19/2023   CALCIUM  10.0 03/19/2023   GFR 99.75 03/19/2023   GFRNONAA 132.39 12/12/2008   Lab Results  Component Value Date   WBC 8.0 03/19/2023   HGB 16.0 03/19/2023   HCT 47.6 03/19/2023   MCV 90.6 03/19/2023   PLT 237.0 03/19/2023     Immunization status:  Immunization History  Administered Date(s) Administered   Influenza Inj Mdck Quad Pf 11/30/2018   Influenza,inj,Quad PF,6+ Mos 07/07/2013, 07/08/2019, 06/28/2020   Influenza-Unspecified 12/01/2018   PFIZER(Purple Top)SARS-COV-2 Vaccination 11/30/2019, 12/28/2019   Tdap 12/31/2016     I reviewed prior external note(s) from pcp  I reviewed the result(s) of the labs and imaging as noted above.   I have ordered sleep study   Assessment and Plan Assessment & Plan Obstructive Sleep Apnea Suspected based on  symptoms and anatomical predisposition. Discussed pathophysiology, potential complications, and treatment options.  - Order home sleep study to confirm diagnosis. - Discuss CPAP therapy as primary treatment if confirmed. - Discuss alternative treatments for CPAP intolerance including mouth guard and hypoglossal nerve stimulator - Follow-up after sleep study to discuss results and treatment options. - Initiate CPAP therapy if indicated and follow up within 30 to 90 days to ensure compliance  and effectiveness.  Tobacco Use Cessation in 2018. Light vaping.  Follow-up Plan for post-sleep study and CPAP therapy initiation follow-up. - Follow up after home sleep study to discuss results and treatment options. - Follow up within 30 to 90 days after starting CPAP therapy to ensure compliance and effectiveness. - Annual follow-up to monitor ongoing effectiveness of CPAP therapy.   Return to Care: Return in about 2 months (around 03/19/2024).  Louie Rover, MD Pulmonary and Critical Care Medicine [facility] [phone]  CC: Roslyn Coombe, MD

## 2024-01-18 NOTE — Patient Instructions (Addendum)
 It was a pleasure to see you today!  Please schedule follow up with myself in 2 months.  If my schedule is not open yet, we will contact you with a reminder closer to that time. Please call [phone] if you haven't heard from us  a month before, and always call us  sooner if issues or concerns arise. You can also send us  a message through MyChart, but but aware that this is not to be used for urgent issues and it may take up to 5-7 days to receive a reply. Please be aware that you will likely be able to view your results before I have a chance to respond to them. Please give us  5 business days to respond to any non-urgent results.     -OBSTRUCTIVE SLEEP APNEA: Obstructive sleep apnea is a condition where your airway becomes blocked during sleep, causing irregular breathing and poor sleep quality. We will confirm the diagnosis with a home sleep study. If confirmed, we will start CPAP therapy, which helps keep your airway open during sleep. We will discuss alternative treatments if you cannot tolerate CPAP. Follow up after the sleep study to discuss results and treatment options, and again within 30 to 90 days after starting CPAP to ensure it is working well for you.

## 2024-02-17 ENCOUNTER — Encounter: Payer: Self-pay | Admitting: Internal Medicine

## 2024-02-17 DIAGNOSIS — F988 Other specified behavioral and emotional disorders with onset usually occurring in childhood and adolescence: Secondary | ICD-10-CM

## 2024-02-17 MED ORDER — METHYLPHENIDATE HCL ER (OSM) 36 MG PO TBCR: 36.0000 mg | EXTENDED_RELEASE_TABLET | Freq: Every day | ORAL | 0 refills | Status: DC

## 2024-02-18 DIAGNOSIS — M19032 Primary osteoarthritis, left wrist: Secondary | ICD-10-CM | POA: Diagnosis not present

## 2024-03-20 ENCOUNTER — Encounter: Payer: Self-pay | Admitting: Internal Medicine

## 2024-03-20 DIAGNOSIS — F988 Other specified behavioral and emotional disorders with onset usually occurring in childhood and adolescence: Secondary | ICD-10-CM

## 2024-03-21 MED ORDER — METHYLPHENIDATE HCL ER (OSM) 36 MG PO TBCR: 36.0000 mg | EXTENDED_RELEASE_TABLET | Freq: Every day | ORAL | 0 refills | Status: DC

## 2024-03-23 ENCOUNTER — Ambulatory Visit: Payer: Self-pay | Admitting: Internal Medicine

## 2024-03-23 ENCOUNTER — Ambulatory Visit (INDEPENDENT_AMBULATORY_CARE_PROVIDER_SITE_OTHER): Admitting: Internal Medicine

## 2024-03-23 ENCOUNTER — Encounter: Payer: Self-pay | Admitting: Internal Medicine

## 2024-03-23 VITALS — BP 160/100 | HR 85 | Temp 98.7°F | Ht 72.0 in | Wt 239.0 lb

## 2024-03-23 DIAGNOSIS — E559 Vitamin D deficiency, unspecified: Secondary | ICD-10-CM

## 2024-03-23 DIAGNOSIS — R739 Hyperglycemia, unspecified: Secondary | ICD-10-CM

## 2024-03-23 DIAGNOSIS — Z125 Encounter for screening for malignant neoplasm of prostate: Secondary | ICD-10-CM | POA: Diagnosis not present

## 2024-03-23 DIAGNOSIS — F4321 Adjustment disorder with depressed mood: Secondary | ICD-10-CM

## 2024-03-23 DIAGNOSIS — Z860100 Personal history of colon polyps, unspecified: Secondary | ICD-10-CM

## 2024-03-23 DIAGNOSIS — R21 Rash and other nonspecific skin eruption: Secondary | ICD-10-CM

## 2024-03-23 DIAGNOSIS — I10 Essential (primary) hypertension: Secondary | ICD-10-CM | POA: Diagnosis not present

## 2024-03-23 DIAGNOSIS — F1010 Alcohol abuse, uncomplicated: Secondary | ICD-10-CM

## 2024-03-23 DIAGNOSIS — E538 Deficiency of other specified B group vitamins: Secondary | ICD-10-CM | POA: Diagnosis not present

## 2024-03-23 DIAGNOSIS — Z0001 Encounter for general adult medical examination with abnormal findings: Secondary | ICD-10-CM

## 2024-03-23 DIAGNOSIS — F32A Depression, unspecified: Secondary | ICD-10-CM

## 2024-03-23 DIAGNOSIS — E7800 Pure hypercholesterolemia, unspecified: Secondary | ICD-10-CM | POA: Diagnosis not present

## 2024-03-23 DIAGNOSIS — F988 Other specified behavioral and emotional disorders with onset usually occurring in childhood and adolescence: Secondary | ICD-10-CM | POA: Diagnosis not present

## 2024-03-23 LAB — URINALYSIS, ROUTINE W REFLEX MICROSCOPIC
Bilirubin Urine: NEGATIVE
Hgb urine dipstick: NEGATIVE
Leukocytes,Ua: NEGATIVE
Nitrite: NEGATIVE
RBC / HPF: NONE SEEN (ref 0–?)
Specific Gravity, Urine: >=1.03 — AB (ref 1.000–1.030)
Total Protein, Urine: NEGATIVE
Urine Glucose: NEGATIVE
Urobilinogen, UA: 0.2 (ref 0.0–1.0)
pH: 6 (ref 5.0–8.0)

## 2024-03-23 LAB — HEPATIC FUNCTION PANEL
ALT: 99 U/L — ABNORMAL HIGH (ref 0–53)
AST: 62 U/L — ABNORMAL HIGH (ref 0–37)
Albumin: 4.7 g/dL (ref 3.5–5.2)
Alkaline Phosphatase: 68 U/L (ref 39–117)
Bilirubin, Direct: 0.2 mg/dL (ref 0.0–0.3)
Total Bilirubin: 0.7 mg/dL (ref 0.2–1.2)
Total Protein: 7.3 g/dL (ref 6.0–8.3)

## 2024-03-23 LAB — CBC WITH DIFFERENTIAL/PLATELET
Basophils Absolute: 0.1 10*3/uL (ref 0.0–0.1)
Basophils Relative: 1.3 % (ref 0.0–3.0)
Eosinophils Absolute: 0.2 10*3/uL (ref 0.0–0.7)
Eosinophils Relative: 3.2 % (ref 0.0–5.0)
HCT: 46.3 % (ref 39.0–52.0)
Hemoglobin: 15.6 g/dL (ref 13.0–17.0)
Lymphocytes Relative: 29.6 % (ref 12.0–46.0)
Lymphs Abs: 2 10*3/uL (ref 0.7–4.0)
MCHC: 33.8 g/dL (ref 30.0–36.0)
MCV: 89.2 fl (ref 78.0–100.0)
Monocytes Absolute: 0.4 10*3/uL (ref 0.1–1.0)
Monocytes Relative: 6.7 % (ref 3.0–12.0)
Neutro Abs: 3.9 10*3/uL (ref 1.4–7.7)
Neutrophils Relative %: 59.2 % (ref 43.0–77.0)
Platelets: 245 10*3/uL (ref 150.0–400.0)
RBC: 5.19 Mil/uL (ref 4.22–5.81)
RDW: 13 % (ref 11.5–15.5)
WBC: 6.6 10*3/uL (ref 4.0–10.5)

## 2024-03-23 LAB — BASIC METABOLIC PANEL WITH GFR
BUN: 11 mg/dL (ref 6–23)
CO2: 32 meq/L (ref 19–32)
Calcium: 10 mg/dL (ref 8.4–10.5)
Chloride: 101 meq/L (ref 96–112)
Creatinine, Ser: 0.78 mg/dL (ref 0.40–1.50)
GFR: 100.17 mL/min (ref >60.00)
Glucose, Bld: 122 mg/dL — ABNORMAL HIGH (ref 70–99)
Potassium: 4.8 meq/L (ref 3.5–5.1)
Sodium: 140 meq/L (ref 135–145)

## 2024-03-23 LAB — LIPID PANEL
Cholesterol: 149 mg/dL (ref 0–200)
HDL: 45.1 mg/dL (ref >39.00)
LDL Cholesterol: 69 mg/dL (ref 0–99)
NonHDL: 103.65
Total CHOL/HDL Ratio: 3
Triglycerides: 171 mg/dL — ABNORMAL HIGH (ref 0.0–149.0)
VLDL: 34.2 mg/dL (ref 0.0–40.0)

## 2024-03-23 LAB — PSA: PSA: 0.3 ng/mL (ref 0.10–4.00)

## 2024-03-23 LAB — VITAMIN B12: Vitamin B-12: 535 pg/mL (ref 211–911)

## 2024-03-23 LAB — HEMOGLOBIN A1C: Hgb A1c MFr Bld: 5.8 % (ref 4.6–6.5)

## 2024-03-23 LAB — TSH: TSH: 0.73 u[IU]/mL (ref 0.35–5.50)

## 2024-03-23 LAB — VITAMIN D 25 HYDROXY (VIT D DEFICIENCY, FRACTURES): VITD: 29.21 ng/mL — ABNORMAL LOW (ref 30.00–100.00)

## 2024-03-23 MED ORDER — FLUCONAZOLE 100 MG PO TABS: 100.0000 mg | ORAL_TABLET | Freq: Every day | ORAL | 0 refills | Status: AC

## 2024-03-23 MED ORDER — LISINOPRIL 20 MG PO TABS: 20.0000 mg | ORAL_TABLET | Freq: Every day | ORAL | 3 refills | Status: AC

## 2024-03-23 MED ORDER — CITALOPRAM HYDROBROMIDE 10 MG PO TABS: 10.0000 mg | ORAL_TABLET | Freq: Every day | ORAL | 3 refills | Status: AC

## 2024-03-23 MED ORDER — MELOXICAM 15 MG PO TABS: 15.0000 mg | ORAL_TABLET | Freq: Every day | ORAL | 3 refills | Status: AC

## 2024-03-23 MED ORDER — KETOCONAZOLE 2 % EX CREA: 1.0000 | TOPICAL_CREAM | Freq: Every day | CUTANEOUS | 1 refills | Status: AC

## 2024-03-23 MED ORDER — METHYLPHENIDATE HCL ER (OSM) 36 MG PO TBCR: 36.0000 mg | EXTENDED_RELEASE_TABLET | Freq: Every day | ORAL | 0 refills | Status: DC

## 2024-03-23 MED ORDER — TRIAMCINOLONE ACETONIDE 55 MCG/ACT NA AERO: 2.0000 | INHALATION_SPRAY | Freq: Every day | NASAL | 12 refills | Status: AC

## 2024-03-23 MED ORDER — ROSUVASTATIN CALCIUM 40 MG PO TABS: 40.0000 mg | ORAL_TABLET | Freq: Every day | ORAL | 3 refills | Status: AC

## 2024-03-23 MED ORDER — CHOLECALCIFEROL 50 MCG (2000 UT) PO TABS: ORAL_TABLET | ORAL | Status: AC

## 2024-03-23 NOTE — Progress Notes (Signed)
 Patient ID: Kenneth Atkins, male   DOB: Dec 01, 1965, 58 y.o.   MRN: [id number]        Chief Complaint:: wellness exam and Annual Exam (Patches all over the body )   Rash, depression, grief, ETOH use disorder, htn       HPI:  Kenneth Atkins is a 58 y.o. male here for wellness exam; due for shingrix #2 at pharmacy, due for colonoscopy, o/w up to date                        Also gained only 4 lbs. BP has been controlled at home per pt, but admits has not checked recently, does not want change for now.  Pt denies chest pain, increased sob or doe, wheezing, orthopnea, PND, increased LE swelling, palpitations, dizziness or syncope.   Pt denies polydipsia, polyuria, or new focal neuro s/s.    Pt denies fever, wt loss, night sweats, loss of appetite, or other constitutional symptoms  ADD med working well, but has worsening grief  regarding recent relative passing, just can't seem to get over it, though has been less depressed.  Still drinking ETOH moderate. Works in a warm environment with sweaty shirts, now with worsening 1 wk diffuse nontender rashes to torso.     Wt Readings from Last 3 Encounters:  03/23/24 239 lb (108.4 kg)  01/18/24 240 lb (108.9 kg)  03/19/23 234 lb (106.1 kg)   BP Readings from Last 3 Encounters:  03/23/24 (!) 160/100  01/18/24 132/86  03/19/23 124/82   Immunization History  Administered Date(s) Administered   Influenza Inj Mdck Quad Pf 11/30/2018   Influenza,inj,Quad PF,6+ Mos 07/07/2013, 07/08/2019, 06/28/2020   Influenza-Unspecified 12/01/2018   PFIZER(Purple Top)SARS-COV-2 Vaccination 11/30/2019, 12/28/2019   Tdap 12/31/2016   Zoster Recombinant(Shingrix) 01/18/2024   Health Maintenance Due  Topic Date Due   Hepatitis B Vaccines (1 of 3 - 19+ 3-dose series) Never done   Lung Cancer Screening  Never done   Colonoscopy  08/18/2023   Zoster Vaccines- Shingrix (2 of 2) 03/14/2024      Past Medical History:  Diagnosis Date   Allergy    Arthritis     Attention deficit disorder of adult 12/12/2008   Qualifier: Diagnosis of  By: Norleen MD, Lynwood ORN    GERD (gastroesophageal reflux disease)    Hyperlipidemia    Hypertension    Seizure disorder (HCC)    None since 2001- pt states none since 2001- off Depakote    Past Surgical History:  Procedure Laterality Date   CHOLECYSTECTOMY      reports that he quit smoking about 7 years ago. His smoking use included cigarettes. He started smoking about 35 years ago. He has a 28 pack-year smoking history. He has never used smokeless tobacco. He reports current alcohol use of about 12.0 standard drinks of alcohol per week. He reports that he does not use drugs. family history includes Alcohol abuse in his father; Dementia in his mother; Hypertension in his brother and father; Sleep apnea in his maternal grandmother. No Known Allergies No current outpatient medications on file prior to visit.   No current facility-administered medications on file prior to visit.        ROS:  All others reviewed and negative.  Objective        PE:  BP (!) 160/100 (BP Location: Right Arm, Patient Position: Sitting, Cuff Size: Normal)   Pulse 85   Temp 98.7 F (37.1 C) (  Oral)   Ht 6' (1.829 m)   Wt 239 lb (108.4 kg)   SpO2 98%   BMI 32.41 kg/m                 Constitutional: Pt appears in NAD               HENT: Head: NCAT.                Right Ear: External ear normal.                 Left Ear: External ear normal.                Eyes: . Pupils are equal, round, and reactive to light. Conjunctivae and EOM are normal               Nose: without d/c or deformity               Neck: Neck supple. Gross normal ROM               Cardiovascular: Normal rate and regular rhythm.                 Pulmonary/Chest: Effort normal and breath sounds without rales or wheezing.                Abd:  Soft, NT, ND, + BS, no organomegaly               Neurological: Pt is alert. At baseline orientation, motor grossly intact                Skin: Skin is warm. No rashes, no other new lesions, LE edema - none               Psychiatric: Pt behavior is normal without agitation   Micro: none  Cardiac tracings I have personally interpreted today:  none  Pertinent Radiological findings (summarize): none   Lab Results  Component Value Date   WBC 6.6 03/23/2024   HGB 15.6 03/23/2024   HCT 46.3 03/23/2024   PLT 245.0 03/23/2024   GLUCOSE 122 (H) 03/23/2024   CHOL 149 03/23/2024   TRIG 171.0 (H) 03/23/2024   HDL 45.10 03/23/2024   LDLDIRECT 122.0 12/11/2021   LDLCALC 69 03/23/2024   ALT 99 (H) 03/23/2024   AST 62 (H) 03/23/2024   NA 140 03/23/2024   K 4.8 03/23/2024   CL 101 03/23/2024   CREATININE 0.78 03/23/2024   BUN 11 03/23/2024   CO2 32 03/23/2024   TSH 0.73 03/23/2024   PSA 0.30 03/23/2024   HGBA1C 5.8 03/23/2024   Assessment/Plan:  Kenneth Atkins is a 58 y.o. White or Caucasian [1] male with  has a past medical history of Allergy, Arthritis, Attention deficit disorder of adult (12/12/2008), GERD (gastroesophageal reflux disease), Hyperlipidemia, Hypertension, and Seizure disorder (HCC).  Encounter for well adult exam with abnormal findings Age and sex appropriate education and counseling updated with regular exercise and diet Referrals for preventative services - for colonoscopy referral Immunizations addressed - for shingrix #2 at the pharmacy,  Smoking counseling  - none needed Evidence for depression or other mood disorder - grief persistently, depression improved recently Most recent labs reviewed. I have personally reviewed and have noted: 1) the patient's medical and social history 2) The patient's current medications and supplements 3) The patient's height, weight, and BMI have been recorded in the chart   Attention deficit disorder of adult Stable overall ,  continue med tx  Vitamin D  deficiency Last vitamin D  Lab Results  Component Value Date   VD25OH 29.21 (L) 03/23/2024   Low,  to start oral replacement   Hyperlipidemia Lab Results  Component Value Date   LDLCALC 69 03/23/2024   Stable, pt to continue current statin crestor  40 qd   Hyperglycemia Lab Results  Component Value Date   HGBA1C 5.8 03/23/2024   Stable, pt to continue current medical treatment  - diet, wt control   Essential hypertension BP Readings from Last 3 Encounters:  03/23/24 (!) 160/100  01/18/24 132/86  03/19/23 124/82   Uncontrolled, pt to continue medical treatment lisinopril  20 every day, decliens change for now   Depression Recent mild improved, cont current tx  Grief Recent persistent and worsening, for referral grief counseling  Alcohol abuse Now with moderate use per pt daily, encouraged abstinence  Rash C/w fungal rash - for diflucan  100 every day x 7d  Followup: Return in about 1 year (around 03/23/2025).  Lynwood Rush, MD 03/26/2024 4:45 PM  [facility]

## 2024-03-23 NOTE — Progress Notes (Signed)
 The test results show that your current treatment is OK, as the tests are stable.  Please continue the same plan.  There is no other need for change of treatment or further evaluation based on these results, at this time.  thanks

## 2024-03-23 NOTE — Patient Instructions (Addendum)
 Please have your Shingrix (shingles) shots done at your local pharmacy. - shot #2  Please check your BP at home on a regular basis, and let us  know if the BP is usually more than 130/80  Please take all new medication as prescribed - the diflucan  for the rash  Please continue all other medications as before, and refills have been done if requested.  Please have the pharmacy call with any other refills you may need.  Please continue your efforts at being more active, low cholesterol diet, and weight control.  You are otherwise up to date with prevention measures today.  Please keep your appointments with your specialists as you may have planned  You will be contacted regarding the referral for: colonoscopy, and Grief Counseling  Please go to the LAB at the blood drawing area for the tests to be done  You will be contacted by phone if any changes need to be made immediately.  Otherwise, you will receive a letter about your results with an explanation, but please check with MyChart first.  Please make an Appointment to return for your 1 year visit, or sooner if needed

## 2024-03-25 DIAGNOSIS — G4730 Sleep apnea, unspecified: Secondary | ICD-10-CM | POA: Diagnosis not present

## 2024-03-26 ENCOUNTER — Encounter: Payer: Self-pay | Admitting: Internal Medicine

## 2024-03-26 ENCOUNTER — Encounter

## 2024-03-26 DIAGNOSIS — G4733 Obstructive sleep apnea (adult) (pediatric): Secondary | ICD-10-CM

## 2024-03-26 DIAGNOSIS — G4730 Sleep apnea, unspecified: Secondary | ICD-10-CM | POA: Diagnosis not present

## 2024-03-26 DIAGNOSIS — F1010 Alcohol abuse, uncomplicated: Secondary | ICD-10-CM | POA: Insufficient documentation

## 2024-03-26 DIAGNOSIS — F4321 Adjustment disorder with depressed mood: Secondary | ICD-10-CM | POA: Insufficient documentation

## 2024-03-26 NOTE — Assessment & Plan Note (Signed)
 Stable overall , continue med tx

## 2024-03-26 NOTE — Assessment & Plan Note (Signed)
 BP Readings from Last 3 Encounters:  03/23/24 (!) 160/100  01/18/24 132/86  03/19/23 124/82   Uncontrolled, pt to continue medical treatment lisinopril  20 every day, decliens change for now

## 2024-03-26 NOTE — Assessment & Plan Note (Signed)
 C/w fungal rash - for diflucan  100 every day x 7d

## 2024-03-26 NOTE — Assessment & Plan Note (Signed)
 Now with moderate use per pt daily, encouraged abstinence

## 2024-03-26 NOTE — Assessment & Plan Note (Signed)
 Last vitamin D  Lab Results  Component Value Date   VD25OH 29.21 (L) 03/23/2024   Low, to start oral replacement

## 2024-03-26 NOTE — Assessment & Plan Note (Signed)
 Recent mild improved, cont current tx

## 2024-03-26 NOTE — Assessment & Plan Note (Signed)
 Recent persistent and worsening, for referral grief counseling

## 2024-03-26 NOTE — Assessment & Plan Note (Signed)
 Age and sex appropriate education and counseling updated with regular exercise and diet Referrals for preventative services - for colonoscopy referral Immunizations addressed - for shingrix #2 at the pharmacy,  Smoking counseling  - none needed Evidence for depression or other mood disorder - grief persistently, depression improved recently Most recent labs reviewed. I have personally reviewed and have noted: 1) the patient's medical and social history 2) The patient's current medications and supplements 3) The patient's height, weight, and BMI have been recorded in the chart

## 2024-03-26 NOTE — Assessment & Plan Note (Signed)
 Lab Results  Component Value Date   HGBA1C 5.8 03/23/2024   Stable, pt to continue current medical treatment  - diet, wt control

## 2024-03-26 NOTE — Assessment & Plan Note (Addendum)
 Lab Results  Component Value Date   LDLCALC 69 03/23/2024   Stable, pt to continue current statin crestor  40 qd

## 2024-03-28 ENCOUNTER — Ambulatory Visit: Admitting: Internal Medicine

## 2024-04-07 DIAGNOSIS — G4733 Obstructive sleep apnea (adult) (pediatric): Secondary | ICD-10-CM | POA: Diagnosis not present

## 2024-04-21 ENCOUNTER — Encounter: Payer: Self-pay | Admitting: Internal Medicine

## 2024-04-21 DIAGNOSIS — F988 Other specified behavioral and emotional disorders with onset usually occurring in childhood and adolescence: Secondary | ICD-10-CM

## 2024-04-22 MED ORDER — METHYLPHENIDATE HCL ER (OSM) 36 MG PO TBCR: 36.0000 mg | EXTENDED_RELEASE_TABLET | Freq: Every day | ORAL | 0 refills | Status: DC

## 2024-04-28 ENCOUNTER — Ambulatory Visit: Admitting: Psychology

## 2024-04-28 DIAGNOSIS — F4321 Adjustment disorder with depressed mood: Secondary | ICD-10-CM
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
## 2024-05-12 ENCOUNTER — Ambulatory Visit: Admitting: Psychology

## 2024-05-12 DIAGNOSIS — F4321 Adjustment disorder with depressed mood: Secondary | ICD-10-CM | POA: Diagnosis not present

## 2024-05-12 NOTE — Progress Notes (Signed)
 [facility] Counselor/Therapist Progress Note  Patient ID: Kenneth Atkins, MRN: [id number],    Date: 05/12/2024  Time Spent: 57 mins       start time: 1303   end time: 1400  Treatment Type: Individual Therapy  Reported Symptoms: Pt presents in person in the office, granting consent for the session.  Mental Status Exam: Appearance:  Casual     Behavior: Appropriate  Motor: Normal  Speech/Language:  Clear and Coherent  Affect: Appropriate  Mood: normal  Thought process: normal  Thought content:   WNL  Sensory/Perceptual disturbances:   WNL  Orientation: oriented to person, place, and time/date  Attention: Good  Concentration: Good  Memory: WNL  Fund of knowledge:  Good  Insight:   Good  Judgment:  Good  Impulse Control: Good   Risk Assessment: Danger to Self:  No Self-injurious Behavior: No Danger to Others: No Duty to Warn:no Physical Aggression / Violence:No  Access to Firearms a concern: No  Gang Involvement:No   Subjective: Pt shares that he just got back from a trip to [place] where 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  He had a great time playing music with them and catching up with them.  [name] has said that she would like to move there to continue their life; I am not sure how serious she is about that.  I would love to be there.  Pt shares that [name], [name]'s son, who lives with them has difficulty concentrating on taking his medications and doing what is good for him.  Pt shares that he does not have anyone in town that he plays music with but would like to have that.  Pt shares he is still grieving the loss of his brothers and his mom.  Talked with him about having celebratory thoughts and memories of his brothers and his mom. Pt reminds me that he is ADHD and on medication.  Encouraged pt to continue with his self care activities and we will meet in 2 wks for a follow up session.   Interventions: Cognitive Behavioral  Therapy  Diagnosis:Adjustment disorder with depressed mood  Plan: Treatment Plan Strengths/Abilities:  Intelligent, Intuitive, Willing to participate in therapy Treatment Preferences:  Outpatient Individual Therapy Statement of Needs:  Patient is to use CBT, mindfulness and coping skills to help manage and/or decrease symptoms associated with their diagnosis. Symptoms:  Depressed/Irritable mood, worry, social withdrawal Problems Addressed:  Depressive thoughts, Sadness, Sleep issues, etc. Long Term Goals:  Pt to reduce overall level, frequency, and intensity of the feelings of depression as evidenced by decreased irritability, negative self talk, and helpless feelings from 6 to 7 days/week to 0 to 1 days/week, per client report, for at least 3 consecutive months.  Progress: 30% Short Term Goals:  Pt to verbally express understanding of the relationship between feelings of depression and their impact on thinking patterns and behaviors.  Pt to verbalize an understanding of the role that distorted thinking plays in creating fears, excessive worry, and ruminations.  Progress: 30% Target Date:  05/12/2025 Frequency:  Bi-weekly Modality:  Cognitive Behavioral Therapy Interventions by Therapist:  Therapist will use CBT, Mindfulness exercises, Coping skills and Referrals, as needed by client. Client has verbally approved this treatment plan.  Francis KATHEE Macintosh, [facility]

## 2024-05-26 ENCOUNTER — Ambulatory Visit: Admitting: Psychology

## 2024-05-26 DIAGNOSIS — F4321 Adjustment disorder with depressed mood: Secondary | ICD-10-CM
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
## 2024-06-07 ENCOUNTER — Encounter: Payer: Self-pay | Admitting: Internal Medicine

## 2024-06-07 ENCOUNTER — Ambulatory Visit (INDEPENDENT_AMBULATORY_CARE_PROVIDER_SITE_OTHER): Admitting: Internal Medicine

## 2024-06-07 VITALS — BP 175/108 | HR 57 | Temp 97.8°F | Ht 73.0 in | Wt 246.0 lb

## 2024-06-07 DIAGNOSIS — G4733 Obstructive sleep apnea (adult) (pediatric): Secondary | ICD-10-CM | POA: Diagnosis not present

## 2024-06-07 DIAGNOSIS — J309 Allergic rhinitis, unspecified: Secondary | ICD-10-CM

## 2024-06-07 NOTE — Progress Notes (Signed)
 Kenneth Atkins    [id number]    Apr 19, 1966  Primary Care Physician:John, Lynwood ORN, MD Date of Appointment: 06/07/2024 Established Patient Visit  Chief complaint:   Chief Complaint  Patient presents with   Sleep Apnea     HPI: Kenneth Atkins is a 58 y.o. man who presents for follow up for OSA.   Interval Updates: Home sleep study shows moderate OSA AHI 24/hr Has tried a mouth guard in the past and it hasn't helped.  Feels fatigue, daytime sleepiness.  Works in [employer] so has late nights.   I have reviewed the patient's family social and past medical history and updated as appropriate.   Past Medical History:  Diagnosis Date   Allergy    Arthritis    Attention deficit disorder of adult 12/12/2008   Qualifier: Diagnosis of  By: Norleen MD, Lynwood ORN    GERD (gastroesophageal reflux disease)    Hyperlipidemia    Hypertension    Seizure disorder (HCC)    None since 2001- pt states none since 2001- off Depakote     Past Surgical History:  Procedure Laterality Date   CHOLECYSTECTOMY      Family History  Problem Relation Age of Onset   Dementia Mother    Hypertension Father    Alcohol abuse Father    Hypertension Brother    Sleep apnea Maternal Grandmother    Colon cancer Neg Hx    Colon polyps Neg Hx    Esophageal cancer Neg Hx    Rectal cancer Neg Hx    Stomach cancer Neg Hx     Social History   Occupational History   Not on file  Tobacco Use   Smoking status: Former    Current packs/day: 0.00    Average packs/day: 1 pack/day for 28.0 years (28.0 ttl pk-yrs)    Types: Cigarettes    Start date: 54    Quit date: 2018    Years since quitting: 7.6   Smokeless tobacco: Never   Tobacco comments:    STATES HE QUIT SMOKING A YEAR AGO  Vaping Use   Vaping status: Every Day  Substance and Sexual Activity   Alcohol use: Yes    Alcohol/week: 12.0 standard drinks of alcohol    Types: 12 Cans of beer per week   Drug use: No   Sexual  activity: Not on file     Physical Exam: Blood pressure (!) 175/108, pulse (!) 57, temperature 97.8 F (36.6 C), temperature source Temporal, height 6' 1 (1.854 m), weight 246 lb (111.6 kg), SpO2 98%.  Gen:      No acute distress, large neck ENT:  mallampati IV, no nasal polyps, mucus membranes moist Lungs:    No increased respiratory effort, symmetric chest wall excursion, clear to auscultation bilaterally, no wheezes or crackles CV:         Regular rate and rhythm; no murmurs, rubs, or gallops.  No pedal edema   Data Reviewed: Imaging: I have personally reviewed the   PFTs:   Labs: Lab Results  Component Value Date   NA 140 03/23/2024   K 4.8 03/23/2024   CO2 32 03/23/2024   GLUCOSE 122 (H) 03/23/2024   BUN 11 03/23/2024   CREATININE 0.78 03/23/2024   CALCIUM  10.0 03/23/2024   GFR 100.17 03/23/2024   GFRNONAA 132.39 12/12/2008   Lab Results  Component Value Date   WBC 6.6 03/23/2024   HGB 15.6 03/23/2024  HCT 46.3 03/23/2024   MCV 89.2 03/23/2024   PLT 245.0 03/23/2024    Immunization status: Immunization History  Administered Date(s) Administered   Influenza Inj Mdck Quad Pf 11/30/2018   Influenza,inj,Quad PF,6+ Mos 07/07/2013, 07/08/2019, 06/28/2020   Influenza-Unspecified 12/01/2018   PFIZER(Purple Top)SARS-COV-2 Vaccination 11/30/2019, 12/28/2019   Tdap 12/31/2016   Zoster Recombinant(Shingrix) 01/18/2024    External Records Personally Reviewed: pcp  Assessment:  Moderate OSA AHI 24.1/hr  Plan/Recommendations: I am ordering CPAP therapy for you.  I think treating your moderate sleep apnea will improve many of your symptoms.  Please make sure your follow-up with me is between 31 and 90 days of receiving your CPAP therapy.  Shaving your beard may help find a mask that fits.   Return to Care: Return in about 3 months (around 09/06/2024).   Kenneth Gore, MD Pulmonary and Critical Care Medicine [facility] Office:[phone]

## 2024-06-07 NOTE — Patient Instructions (Addendum)
 It was a pleasure to see you today!  Please schedule follow up with myself in 3 months.  If my schedule is not open yet, we will contact you with a reminder closer to that time. Please call [phone] if you haven't heard from us  a month before, and always call us  sooner if issues or concerns arise. You can also send us  a message through MyChart, but but aware that this is not to be used for urgent issues and it may take up to 5-7 days to receive a reply. Please be aware that you will likely be able to view your results before I have a chance to respond to them. Please give us  5 business days to respond to any non-urgent results.   I am ordering CPAP therapy for you.  I think treating your moderate sleep apnea will improve many of your symptoms.  Please make sure your follow-up with me is between 31 and 90 days of receiving your CPAP therapy.  Shaving your beard may help find a mask that fits.

## 2024-06-16 ENCOUNTER — Ambulatory Visit (INDEPENDENT_AMBULATORY_CARE_PROVIDER_SITE_OTHER): Admitting: Psychology

## 2024-06-16 DIAGNOSIS — R400 Somnolence: Secondary | ICD-10-CM | POA: Diagnosis not present

## 2024-06-16 DIAGNOSIS — G4733 Obstructive sleep apnea (adult) (pediatric): Secondary | ICD-10-CM | POA: Diagnosis not present

## 2024-06-16 DIAGNOSIS — F4321 Adjustment disorder with depressed mood: Secondary | ICD-10-CM | POA: Diagnosis not present

## 2024-06-16 NOTE — Progress Notes (Signed)
 [facility] Counselor/Therapist Progress Note  Patient ID: KWEKU STANKEY, MRN: [id number],    Date: 06/16/2024  Time Spent: 58 mins       start time: 0900   end time: 0958  Treatment Type: Individual Therapy  Reported Symptoms: Pt presents in person in the office, granting consent for the session.  Mental Status Exam: Appearance:  Casual     Behavior: Appropriate  Motor: Normal  Speech/Language:  Clear and Coherent  Affect: Appropriate  Mood: normal  Thought process: normal  Thought content:   WNL  Sensory/Perceptual disturbances:   WNL  Orientation: oriented to person, place, and time/date  Attention: Good  Concentration: Good  Memory: WNL  Fund of knowledge:  Good  Insight:   Good  Judgment:  Good  Impulse Control: Good   Risk Assessment: Danger to Self:  No Self-injurious Behavior: No Danger to Others: No Duty to Warn:no Physical Aggression / Violence:No  Access to Firearms a concern: No  Gang Involvement:No   Subjective: Pt shares that he is scheduled to get his CPAP machine this afternoon; his PCP has been encouraging pt to really stick with it.  Pt also shares that he is really overwhelmed by all the national and international news situations.  Encouraged pt to be intentional about not engaging in the news for periods of time.  Pt has been walking, sometimes without ear buds, and he has been able to experience being more present and he likes that.  He has a new book that he is enjoying right now; it is part of a series that he enjoys.  He is glad that he is now treated for his ADHD and that allows him to focus on reading more than he ever could before.  Pt has ordered Ozempic and is hopeful that it will help him eat and drink less.  Talked with pt about the concepts of balance and using intentionality to be more healthy in all areas of his life.  Pt continues to drink more beer than he wants to and we talked again about balance and intentionality with it  as well.  Pt shares that he and [name] have been talking a lot more about their lives and the business.  Pt also mentions that [name] has been doing well lately; he went to [employer] in [place] and has been doing better since he got back.  They connect over comic books that they have collected over the years; they spent time last evening talking about these and pt enjoyed that time.  Pt and [name] are working on cleaning out their office at home so he can have a place to play his music and a place for [employer] to paint.     Encouraged pt to continue with his self care activities and we will meet in 3 wks for a follow up session.   Interventions: Cognitive Behavioral Therapy  Diagnosis:Adjustment disorder with depressed mood  Plan: Treatment Plan Strengths/Abilities:  Intelligent, Intuitive, Willing to participate in therapy Treatment Preferences:  Outpatient Individual Therapy Statement of Needs:  Patient is to use CBT, mindfulness and coping skills to help manage and/or decrease symptoms associated with their diagnosis. Symptoms:  Depressed/Irritable mood, worry, social withdrawal Problems Addressed:  Depressive thoughts, Sadness, Sleep issues, etc. Long Term Goals:  Pt to reduce overall level, frequency, and intensity of the feelings of depression as evidenced by decreased irritability, negative self talk, and helpless feelings from 6 to 7 days/week to 0 to 1 days/week, per  client report, for at least 3 consecutive months.  Progress: 30% Short Term Goals:  Pt to verbally express understanding of the relationship between feelings of depression and their impact on thinking patterns and behaviors.  Pt to verbalize an understanding of the role that distorted thinking plays in creating fears, excessive worry, and ruminations.  Progress: 30% Target Date:  05/12/2025 Frequency:  Bi-weekly Modality:  Cognitive Behavioral Therapy Interventions by Therapist:  Therapist will use CBT, Mindfulness exercises, Coping  skills and Referrals, as needed by client. Client has verbally approved this treatment plan.  Francis KATHEE Macintosh, [facility]

## 2024-06-23 ENCOUNTER — Encounter: Payer: Self-pay | Admitting: Internal Medicine

## 2024-06-27 ENCOUNTER — Other Ambulatory Visit: Payer: Self-pay | Admitting: Internal Medicine

## 2024-06-27 DIAGNOSIS — F988 Other specified behavioral and emotional disorders with onset usually occurring in childhood and adolescence: Secondary | ICD-10-CM

## 2024-06-27 MED ORDER — METHYLPHENIDATE HCL ER (OSM) 36 MG PO TBCR: 36.0000 mg | EXTENDED_RELEASE_TABLET | Freq: Every day | ORAL | 0 refills | Status: DC

## 2024-06-27 NOTE — Telephone Encounter (Signed)
 Copied from CRM #[id number]. Topic: Clinical - Medication Refill >> Jun 27, 2024 12:23 PM Aisha D wrote: Medication: methylphenidate  (CONCERTA ) 36 MG PO CR tablet  Has the patient contacted their pharmacy? Yes (Agent: If no, request that the patient contact the pharmacy for the refill. If patient does not wish to contact the pharmacy document the reason why and proceed with request.) (Agent: If yes, when and what did the pharmacy advise?)  This is the patient's preferred pharmacy:  [id number] - [place], [place] - [address] [facility] [address] [place] [address] Phone: [phone] Fax: [phone]    Is this the correct pharmacy for this prescription? Yes If no, delete pharmacy and type the correct one.   Has the prescription been filled recently? No  Is the patient out of the medication? Yes  Has the patient been seen for an appointment in the last year OR does the patient have an upcoming appointment? Yes  Can we respond through MyChart? Yes  Agent: Please be advised that Rx refills may take up to 3 business days. We ask that you follow-up with your pharmacy.

## 2024-06-28 ENCOUNTER — Ambulatory Visit: Admitting: Psychology

## 2024-06-28 DIAGNOSIS — F4321 Adjustment disorder with depressed mood: Secondary | ICD-10-CM

## 2024-06-28 NOTE — Progress Notes (Signed)
 [facility] Counselor/Therapist Progress Note  Patient ID: Kenneth Atkins, MRN: [id number],    Date: 06/28/2024  Time Spent: 54 mins       start time: 1400   end time: 1454  Treatment Type: Individual Therapy  Reported Symptoms: Pt presents in person in the office, granting consent for the session.  Mental Status Exam: Appearance:  Casual     Behavior: Appropriate  Motor: Normal  Speech/Language:  Clear and Coherent  Affect: Appropriate  Mood: normal  Thought process: normal  Thought content:   WNL  Sensory/Perceptual disturbances:   WNL  Orientation: oriented to person, place, and time/date  Attention: Good  Concentration: Good  Memory: WNL  Fund of knowledge:  Good  Insight:   Good  Judgment:  Good  Impulse Control: Good   Risk Assessment: Danger to Self:  No Self-injurious Behavior: No Danger to Others: No Duty to Warn:no Physical Aggression / Violence:No  Access to Firearms a concern: No  Gang Involvement:No   Subjective: Pt shares that he is doing pretty well since our last session.  I have been communicating better with [name] at home and my CPAP machine is working great for me and for [name] too.  I have also started the weight loss medication (Ozempic) and it seems to have taken my thoughts about food away.  I am eating but I am choosing what I want to eat.  These positive things are really good for me.  Encouraged pt to continue working with his CPAP machine to get even more used to it.  Pt and [name] are going to [place] in January and they are looking forward to that.  Pt shares that they had catered an engagement part for a family friend and that event went well; everyone was thrilled with the food.  Pt has been more intentional about decreasing the amount of news he is consuming on TV and that is working for him.  Pt shares that [name] is staying busy with lots of different things; he is working pretty consistently in several areas.  Pt continues to  play music and he is reading a book in the [employer] H[employer] series; he is continues to walk regularly as well as a self care activity.  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  Pt and [name] are working on cleaning out their office at home so he can have a place to play his music and a place for [name] to paint; they are making progress slowly but surely.  Encouraged pt to continue with his self care activities and we will meet in 2 wks for a follow up session.   Interventions: Cognitive Behavioral Therapy  Diagnosis:Adjustment disorder with depressed mood  Plan: Treatment Plan Strengths/Abilities:  Intelligent, Intuitive, Willing to participate in therapy Treatment Preferences:  Outpatient Individual Therapy Statement of Needs:  Patient is to use CBT, mindfulness and coping skills to help manage and/or decrease symptoms associated with their diagnosis. Symptoms:  Depressed/Irritable mood, worry, social withdrawal Problems Addressed:  Depressive thoughts, Sadness, Sleep issues, etc. Long Term Goals:  Pt to reduce overall level, frequency, and intensity of the feelings of depression as evidenced by decreased irritability, negative self talk, and helpless feelings from 6 to 7 days/week to 0 to 1 days/week, per client report, for at least 3 consecutive months.  Progress: 30% Short Term Goals:  Pt to verbally  express understanding of the relationship between feelings of depression and their impact on thinking patterns and behaviors.  Pt to verbalize an understanding of the role that distorted thinking plays in creating fears, excessive worry, and ruminations.  Progress: 30% Target Date:  05/12/2025 Frequency:  Bi-weekly Modality:  Cognitive Behavioral Therapy Interventions by Therapist:  Therapist will use CBT, Mindfulness exercises, Coping skills and Referrals, as needed by client. Client has verbally  approved this treatment plan.  Francis KATHEE Macintosh, [facility]

## 2024-07-14 ENCOUNTER — Ambulatory Visit (INDEPENDENT_AMBULATORY_CARE_PROVIDER_SITE_OTHER): Admitting: Psychology

## 2024-07-14 DIAGNOSIS — F4321 Adjustment disorder with depressed mood: Secondary | ICD-10-CM | POA: Diagnosis not present

## 2024-07-14 NOTE — Progress Notes (Signed)
 [facility] Counselor/Therapist Progress Note  Patient ID: Kenneth Atkins, MRN: [id number],    Date: 07/14/2024  Time Spent: 58 mins       start time: 1300   end time: 1358  Treatment Type: Individual Therapy  Reported Symptoms: Pt presents in person in the office, granting consent for the session.  Mental Status Exam: Appearance:  Casual     Behavior: Appropriate  Motor: Normal  Speech/Language:  Clear and Coherent  Affect: Appropriate  Mood: normal  Thought process: normal  Thought content:   WNL  Sensory/Perceptual disturbances:   WNL  Orientation: oriented to person, place, and time/date  Attention: Good  Concentration: Good  Memory: WNL  Fund of knowledge:  Good  Insight:   Good  Judgment:  Good  Impulse Control: Good   Risk Assessment: Danger to Self:  No Self-injurious Behavior: No Danger to Others: No Duty to Warn:no Physical Aggression / Violence:No  Access to Firearms a concern: No  Gang Involvement:No   Subjective: Pt shares that he is doing pretty well since our last session.  I am getting good sleep now with my CPAP and that is good.  We had a busy weekend with [employer] homecoming but we did well.  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  He is trying to think of ways that he could make it happen because he enjoys the relatives.  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  Pt continues to take his Ozempic weekly (last 4 wks) and is enjoying walking as a self care activity.  Pt and [name] are going to [place] in January to see a music festival and they are looking forward to that.  Pt is continuing to think about getting a COVID shot because of that trip.  Pt has the [facility] 10th Anniversary coming up in November and he is looking forward to that.  Pt shares that his brother, [name], in [place], [place] had a grand mal seizure while he  was out at lunch; he had had a brain injury within the past year as well (fell and hit his head).  He only has [name] and Newell (sister who lives in [place]) left as siblings.  Pt continues to play music and he is reading a book in the [employer] Horses series; he is continues to walk regularly as well as a self care activity.  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  Encouraged pt to continue with his self care activities and we will meet in 3 wks for a follow up session.   Interventions: Cognitive Behavioral Therapy  Diagnosis:Adjustment disorder with depressed mood  Plan: Treatment Plan Strengths/Abilities:  Intelligent, Intuitive, Willing to participate in therapy Treatment Preferences:  Outpatient Individual Therapy Statement of Needs:  Patient is to use CBT, mindfulness and coping skills to help manage and/or decrease symptoms associated with their diagnosis. Symptoms:  Depressed/Irritable mood, worry, social withdrawal Problems Addressed:  Depressive thoughts, Sadness, Sleep issues, etc. Long Term Goals:  Pt to reduce overall level, frequency, and intensity of the feelings of depression as evidenced by decreased irritability, negative self talk, and  helpless feelings from 6 to 7 days/week to 0 to 1 days/week, per client report, for at least 3 consecutive months.  Progress: 30% Short Term Goals:  Pt to verbally express understanding of the relationship between feelings of depression and their impact on thinking patterns and behaviors.  Pt to verbalize an understanding of the role that distorted thinking plays in creating fears, excessive worry, and ruminations.  Progress: 30% Target Date:  05/12/2025 Frequency:  Bi-weekly Modality:  Cognitive Behavioral Therapy Interventions by Therapist:  Therapist  will use CBT, Mindfulness exercises, Coping skills and Referrals, as needed by client. Client has verbally approved this treatment plan.  Francis KATHEE Macintosh, [facility]

## 2024-07-16 DIAGNOSIS — R400 Somnolence: Secondary | ICD-10-CM | POA: Diagnosis not present

## 2024-07-25 MED ORDER — METHYLPHENIDATE HCL ER (OSM) 36 MG PO TBCR: 36.0000 mg | EXTENDED_RELEASE_TABLET | Freq: Every day | ORAL | 0 refills | Status: DC

## 2024-08-04 ENCOUNTER — Ambulatory Visit (INDEPENDENT_AMBULATORY_CARE_PROVIDER_SITE_OTHER): Admitting: Psychology

## 2024-08-04 DIAGNOSIS — F4321 Adjustment disorder with depressed mood: Secondary | ICD-10-CM

## 2024-08-04 NOTE — Progress Notes (Signed)
 [facility] Counselor/Therapist Progress Note  Patient ID: Kenneth Atkins, MRN: [id number],    Date: 08/04/2024  Time Spent: 57 mins       start time: 1300   end time: 1357  Treatment Type: Individual Therapy  Reported Symptoms: Pt presents in person in the office, granting consent for the session.  Mental Status Exam: Appearance:  Casual     Behavior: Appropriate  Motor: Normal  Speech/Language:  Clear and Coherent  Affect: Appropriate  Mood: normal  Thought process: normal  Thought content:   WNL  Sensory/Perceptual disturbances:   WNL  Orientation: oriented to person, place, and time/date  Attention: Good  Concentration: Good  Memory: WNL  Fund of knowledge:  Good  Insight:   Good  Judgment:  Good  Impulse Control: Good   Risk Assessment: Danger to Self:  No Self-injurious Behavior: No Danger to Others: No Duty to Warn:no Physical Aggression / Violence:No  Access to Firearms a concern: No  Gang Involvement:No   Subjective: Pt shares that he has been pretty stressed since our last session.  I just had a big fight with my wife; I am not sure what the future holds for us .  She wants me to make her a priority and I have to work at [employer].  It seems like everything leads to another argument.  Pt shares that they have not shared intimacy in quite some time; pt shares he does not tend to think about it very much any more and he wants to talk to his PCP about this soon.  Pt and Kenneth Atkins have been talking this whole week about hard topics and that has been tough for both of them.  Pt talks about various things he is doing for Kenneth Atkins and for her family (mentally ill brother who lives with her dad).  Pt and Kenneth Atkins have been a couple since 2005 and got married in 2008.  Pt knows that it would be hard to separate; it would be hard on him, her, Kenneth Atkins, and several others.  They have seen a couples therapist in the past and he thinks it would be good to do that again.   Encouraged pt to be as clear as possible with Kenneth Atkins in his communication and request the same from her; reiterate his love for her with her. Encouraged pt to continue with his self care activities and we will meet in 3 wks for a follow up session.   Interventions: Cognitive Behavioral Therapy  Diagnosis:Adjustment disorder with depressed mood  Plan: Treatment Plan Strengths/Abilities:  Intelligent, Intuitive, Willing to participate in therapy Treatment Preferences:  Outpatient Individual Therapy Statement of Needs:  Patient is to use CBT, mindfulness and coping skills to help manage and/or decrease symptoms associated with their diagnosis. Symptoms:  Depressed/Irritable mood, worry, social withdrawal Problems Addressed:  Depressive thoughts, Sadness, Sleep issues, etc. Long Term Goals:  Pt to reduce overall level, frequency, and intensity of the feelings of depression as evidenced by decreased irritability, negative self talk, and helpless feelings from 6 to 7 days/week to 0 to 1 days/week, per client report, for at least 3 consecutive months.  Progress: 30% Short Term Goals:  Pt to verbally express understanding of the relationship between feelings of depression and their impact on thinking patterns and behaviors.  Pt to verbalize an understanding of the role that distorted thinking plays in creating fears, excessive worry, and ruminations.  Progress: 30% Target Date:  05/12/2025 Frequency:  Bi-weekly Modality:  Cognitive Behavioral Therapy  Interventions by Therapist:  Therapist will use CBT, Mindfulness exercises, Coping skills and Referrals, as needed by client. Client has verbally approved this treatment plan.  Francis KATHEE Macintosh, [facility]

## 2024-08-16 ENCOUNTER — Ambulatory Visit (INDEPENDENT_AMBULATORY_CARE_PROVIDER_SITE_OTHER): Admitting: Psychology

## 2024-08-16 DIAGNOSIS — R400 Somnolence: Secondary | ICD-10-CM | POA: Diagnosis not present

## 2024-08-16 DIAGNOSIS — F4321 Adjustment disorder with depressed mood: Secondary | ICD-10-CM

## 2024-08-16 DIAGNOSIS — G4733 Obstructive sleep apnea (adult) (pediatric): Secondary | ICD-10-CM | POA: Diagnosis not present

## 2024-08-16 NOTE — Progress Notes (Signed)
 [facility] Counselor/Therapist Progress Note  Patient ID: Kenneth Atkins, MRN: [id number],    Date: 08/16/2024  Time Spent: 60 mins       start time: 1000   end time: 1100  Treatment Type: Individual Therapy  Reported Symptoms: Pt presents in person in the office, granting consent for the session.  Mental Status Exam: Appearance:  Casual     Behavior: Appropriate  Motor: Normal  Speech/Language:  Clear and Coherent  Affect: Appropriate  Mood: normal  Thought process: normal  Thought content:   WNL  Sensory/Perceptual disturbances:   WNL  Orientation: oriented to person, place, and time/date  Attention: Good  Concentration: Good  Memory: WNL  Fund of knowledge:  Good  Insight:   Good  Judgment:  Good  Impulse Control: Good   Risk Assessment: Danger to Self:  No Self-injurious Behavior: No Danger to Others: No Duty to Warn:no Physical Aggression / Violence:No  Access to Firearms a concern: No  Gang Involvement:No   Subjective: Pt shares that he has been better since our last session.  I sat down with Kenneth Atkins and we had a long talk; we are working on our relationship and I feel better about the direction we are heading.  We spent all day Sunday and Monday together and got along great and got a lot done.  Pt shares that he has been watching the [place] show [employer] and has been enjoying that.  Pt is very glad that he and Kenneth Atkins are getting along better; they will continue to work on their relationship.  They are excited to be going back to [place] for a music festival in January; they last attended in 2023 so they know what they are going to have.  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  He and [place] will be having her dad and brother (mentally ill) for Thanksgiving and then will be going to his sister's [place]) home in [place]; his brother-in-law has had a stroke in the past year but is  recovering.  They have a son their own and have 4 adopted children; two of whom are special needs.  He talks with his sister every Sunday now and he feels really good about that.  His brother Kenneth Atkins) lives in [place], [place]; has a history of a traumatic brain injury and seizures.  He is treated at [facility] and is doing so much better now.  Pt shares that when he goes somewhere with other people he enjoys cooking for others; I am proud to do it for people.  Pt shares that work has been pretty good the past couple of weeks.  His main cook periodically goes through stuff that causes him to want to look for other jobs and he did not come to work for a couple of days; they worked it all out, like they always do; pt likes the way he takes care of people who are close to him.  Pt shares that Kenneth Atkins is doing well.  Pt shares that they just celebrated their 10 yr anniversary at [place].  Pt shares that he continues to play music for a little while several days per week.  He also continues to work on his Wordl puzzles daily and enjoys his podcasts regularly.  Encouraged pt to be as clear as possible with Kenneth Atkins in his communication and request the same from her; reiterate his love for her with  her.  Encouraged pt to continue with his self care activities and we will meet in 3 wks for a follow up session.   Interventions: Cognitive Behavioral Therapy  Diagnosis:Adjustment disorder with depressed mood  Plan: Treatment Plan Strengths/Abilities:  Intelligent, Intuitive, Willing to participate in therapy Treatment Preferences:  Outpatient Individual Therapy Statement of Needs:  Patient is to use CBT, mindfulness and coping skills to help manage and/or decrease symptoms associated with their diagnosis. Symptoms:  Depressed/Irritable mood, worry, social withdrawal Problems Addressed:  Depressive thoughts, Sadness, Sleep issues, etc. Long Term Goals:  Pt to reduce overall level, frequency, and intensity of the  feelings of depression as evidenced by decreased irritability, negative self talk, and helpless feelings from 6 to 7 days/week to 0 to 1 days/week, per client report, for at least 3 consecutive months.  Progress: 30% Short Term Goals:  Pt to verbally At[place] understanding of the relationship between feelings of depression and their impact on thinking patterns and behaviors.  Pt to verbalize an understanding of the role that distorted thinking plays in creating fears, excessive worry, and ruminations.  Progress: 30% Target Date:  05/12/2025 Frequency:  Bi-weekly Modality:  Cognitive Behavioral Therapy Interventions by Therapist:  Therapist will use CBT, Mindfulness exercises, Coping skills and Referrals, as needed by client. Client has verbally approved this treatment plan.  [place], [place]

## 2024-08-24 ENCOUNTER — Encounter: Payer: Self-pay | Admitting: Internal Medicine

## 2024-08-29 MED ORDER — METHYLPHENIDATE HCL ER (OSM) 36 MG PO TBCR: 36.0000 mg | EXTENDED_RELEASE_TABLET | Freq: Every day | ORAL | 0 refills | Status: DC

## 2024-09-08 ENCOUNTER — Ambulatory Visit: Admitting: Psychology

## 2024-09-08 DIAGNOSIS — F4321 Adjustment disorder with depressed mood: Secondary | ICD-10-CM

## 2024-09-08 NOTE — Progress Notes (Signed)
 [facility] Counselor/Therapist Progress Note  Patient ID: Kenneth Atkins, MRN: [id number],    Date: 09/08/2024  Time Spent: 54 mins       start time: 1300   end time: 1354  Treatment Type: Individual Therapy  Reported Symptoms: Pt presents in person in the office, granting consent for the session.  Mental Status Exam: Appearance:  Casual     Behavior: Appropriate  Motor: Normal  Speech/Language:  Clear and Coherent  Affect: Appropriate  Mood: normal  Thought process: normal  Thought content:   WNL  Sensory/Perceptual disturbances:   WNL  Orientation: oriented to person, place, and time/date  Attention: Good  Concentration: Good  Memory: WNL  Fund of knowledge:  Good  Insight:   Good  Judgment:  Good  Impulse Control: Good   Risk Assessment: Danger to Self:  No Self-injurious Behavior: No Danger to Others: No Duty to Warn:no Physical Aggression / Violence:No  Access to Firearms a concern: No  Gang Involvement:No   Notified of retirement  Subjective: Pt shares that he has been better since our last session.  We had a nice Thanksgiving with [name]'s dad joining us .  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  They are fine financially.  Pt shares that he is down 22 pounds so far with the [place]; I just don't think about food as much as I used to and that is great.  Pt shares that he and [name] are getting excited about their trip to [place] (1/14-1/21/26) to see a music festival that they have been to before.  Pt shares that he and [name] are still trying to do at least one thing per week together; pt is sometimes frustrated with the planning process but enjoys spending time with her.  Pt shares that he continues to talk with his brother [name] in [place], [place] and his sister [name]) in [place] and both are doing well.  Pt shares that [name] is doing well; he will get frustrated some times but he catches himself and  apologizes; they are helping him learn about finances.  Pt shares that he continues to play music for a little while several days per week; he played for [name], her brother, and her dad at Thanksgiving and they all enjoyed that time.  He also continues to work on his Wordl puzzles daily and enjoys his podcasts regularly.  Encouraged pt to continue with his self care activities and we will meet in 2 wks for a follow up session.   Interventions: Cognitive Behavioral Therapy  Diagnosis:Adjustment disorder with depressed mood  Plan: Treatment Plan Strengths/Abilities:  Intelligent, Intuitive, Willing to participate in therapy Treatment Preferences:  Outpatient Individual Therapy Statement of Needs:  Patient is to use CBT, mindfulness and coping skills to help manage and/or decrease symptoms associated with their diagnosis. Symptoms:  Depressed/Irritable mood, worry, social withdrawal Problems Addressed:  Depressive thoughts, Sadness, Sleep issues, etc. Long Term Goals:  Pt to reduce overall level, frequency, and intensity of the feelings of depression as evidenced by decreased irritability, negative self talk, and helpless feelings from 6 to 7 days/week to 0 to 1 days/week, per client report, for at least 3 consecutive months.  Progress: 30% Short Term Goals:  Pt to verbally express understanding of the relationship between feelings of depression and their impact on thinking patterns and behaviors.  Pt to verbalize an understanding of the role that distorted thinking plays in  creating fears, excessive worry, and ruminations.  Progress: 30% Target Date:  05/12/2025 Frequency:  Bi-weekly Modality:  Cognitive Behavioral Therapy Interventions by Therapist:  Therapist will use CBT, Mindfulness exercises, Coping skills and Referrals, as needed by client. Client has verbally approved this treatment plan.  Francis KATHEE Macintosh, [facility]

## 2024-09-15 DIAGNOSIS — G4733 Obstructive sleep apnea (adult) (pediatric): Secondary | ICD-10-CM | POA: Diagnosis not present

## 2024-09-15 DIAGNOSIS — R400 Somnolence: Secondary | ICD-10-CM | POA: Diagnosis not present

## 2024-09-20 ENCOUNTER — Ambulatory Visit: Admitting: Psychology

## 2024-09-28 ENCOUNTER — Encounter: Payer: Self-pay | Admitting: Internal Medicine

## 2024-09-30 MED ORDER — METHYLPHENIDATE HCL ER (OSM) 36 MG PO TBCR: 36.0000 mg | EXTENDED_RELEASE_TABLET | Freq: Every day | ORAL | 0 refills | Status: DC

## 2024-10-28 ENCOUNTER — Encounter: Payer: Self-pay | Admitting: Internal Medicine

## 2024-10-28 MED ORDER — METHYLPHENIDATE HCL ER (OSM) 36 MG PO TBCR: 36.0000 mg | EXTENDED_RELEASE_TABLET | Freq: Every day | ORAL | 0 refills | Status: AC
